# Patient Record
Sex: Female | Born: 1955 | Race: White | Hispanic: No | Marital: Married | Smoking: Never smoker
Health system: Southern US, Community
[De-identification: ages and names within clinical notes are randomized; demographics above are authoritative.]

## PROBLEM LIST (undated history)

## (undated) DIAGNOSIS — E039 Hypothyroidism, unspecified: Secondary | ICD-10-CM

## (undated) DIAGNOSIS — C801 Malignant (primary) neoplasm, unspecified: Secondary | ICD-10-CM

## (undated) DIAGNOSIS — T7840XA Allergy, unspecified, initial encounter: Secondary | ICD-10-CM

## (undated) DIAGNOSIS — F32A Depression, unspecified: Secondary | ICD-10-CM

## (undated) DIAGNOSIS — E119 Type 2 diabetes mellitus without complications: Secondary | ICD-10-CM

## (undated) DIAGNOSIS — Z5189 Encounter for other specified aftercare: Secondary | ICD-10-CM

## (undated) DIAGNOSIS — N189 Chronic kidney disease, unspecified: Secondary | ICD-10-CM

## (undated) DIAGNOSIS — K219 Gastro-esophageal reflux disease without esophagitis: Secondary | ICD-10-CM

## (undated) DIAGNOSIS — I10 Essential (primary) hypertension: Secondary | ICD-10-CM

## (undated) DIAGNOSIS — F419 Anxiety disorder, unspecified: Secondary | ICD-10-CM

## (undated) DIAGNOSIS — Z87442 Personal history of urinary calculi: Secondary | ICD-10-CM

## (undated) DIAGNOSIS — H269 Unspecified cataract: Secondary | ICD-10-CM

## (undated) DIAGNOSIS — Z9889 Other specified postprocedural states: Secondary | ICD-10-CM

## (undated) DIAGNOSIS — M1990 Unspecified osteoarthritis, unspecified site: Secondary | ICD-10-CM

## (undated) DIAGNOSIS — E7800 Pure hypercholesterolemia, unspecified: Secondary | ICD-10-CM

## (undated) HISTORY — DX: Encounter for other specified aftercare: Z51.89

## (undated) HISTORY — DX: Depression, unspecified: F32.A

## (undated) HISTORY — DX: Allergy, unspecified, initial encounter: T78.40XA

## (undated) HISTORY — PX: BREAST SURGERY: SHX581

## (undated) HISTORY — PX: ABDOMINAL HYSTERECTOMY: SHX81

## (undated) HISTORY — DX: Unspecified cataract: H26.9

## (undated) HISTORY — DX: Chronic kidney disease, unspecified: N18.9

## (undated) HISTORY — PX: CHOLECYSTECTOMY: SHX55

## (undated) HISTORY — PX: APPENDECTOMY: SHX54

## (undated) HISTORY — PX: TONSILLECTOMY: SUR1361

---

## 2006-12-06 ENCOUNTER — Emergency Department (HOSPITAL_COMMUNITY): Admission: EM | Admit: 2006-12-06 | Discharge: 2006-12-06 | Payer: Self-pay | Admitting: Emergency Medicine

## 2006-12-08 ENCOUNTER — Ambulatory Visit (HOSPITAL_COMMUNITY): Payer: Self-pay | Admitting: Emergency Medicine

## 2006-12-08 ENCOUNTER — Encounter (HOSPITAL_COMMUNITY): Admission: RE | Admit: 2006-12-08 | Discharge: 2007-01-07 | Payer: Self-pay | Admitting: Emergency Medicine

## 2009-04-04 ENCOUNTER — Emergency Department: Payer: Self-pay | Admitting: Emergency Medicine

## 2009-12-05 ENCOUNTER — Ambulatory Visit (HOSPITAL_COMMUNITY): Admission: RE | Admit: 2009-12-05 | Discharge: 2009-12-05 | Payer: Self-pay | Admitting: Podiatry

## 2010-04-15 LAB — GLUCOSE, CAPILLARY: Glucose-Capillary: 134 mg/dL — ABNORMAL HIGH (ref 70–99)

## 2010-04-16 LAB — BASIC METABOLIC PANEL
BUN: 20 mg/dL (ref 6–23)
Calcium: 9.9 mg/dL (ref 8.4–10.5)
Creatinine, Ser: 0.77 mg/dL (ref 0.4–1.2)
GFR calc non Af Amer: >60 mL/min (ref >60)
Glucose, Bld: 82 mg/dL (ref 70–99)
Potassium: 4.3 mEq/L (ref 3.5–5.1)

## 2010-04-16 LAB — HEMOGLOBIN AND HEMATOCRIT, BLOOD: Hemoglobin: 14.2 g/dL (ref 12.0–15.0)

## 2010-04-16 LAB — SURGICAL PCR SCREEN: MRSA, PCR: NEGATIVE

## 2011-09-17 IMAGING — CR DG FOOT COMPLETE 3+V*R*
3 series · 3 of 3 positions shown · non-contrast
Comparison: 11/28/2009.

CLINICAL DATA: This post surgery.  History of hammertoe of fifth
toe.  History of exostosis of fifth toe.

RIGHT FOOT COMPLETE - 3+ VIEW

[view not recorded (1 of 3)]
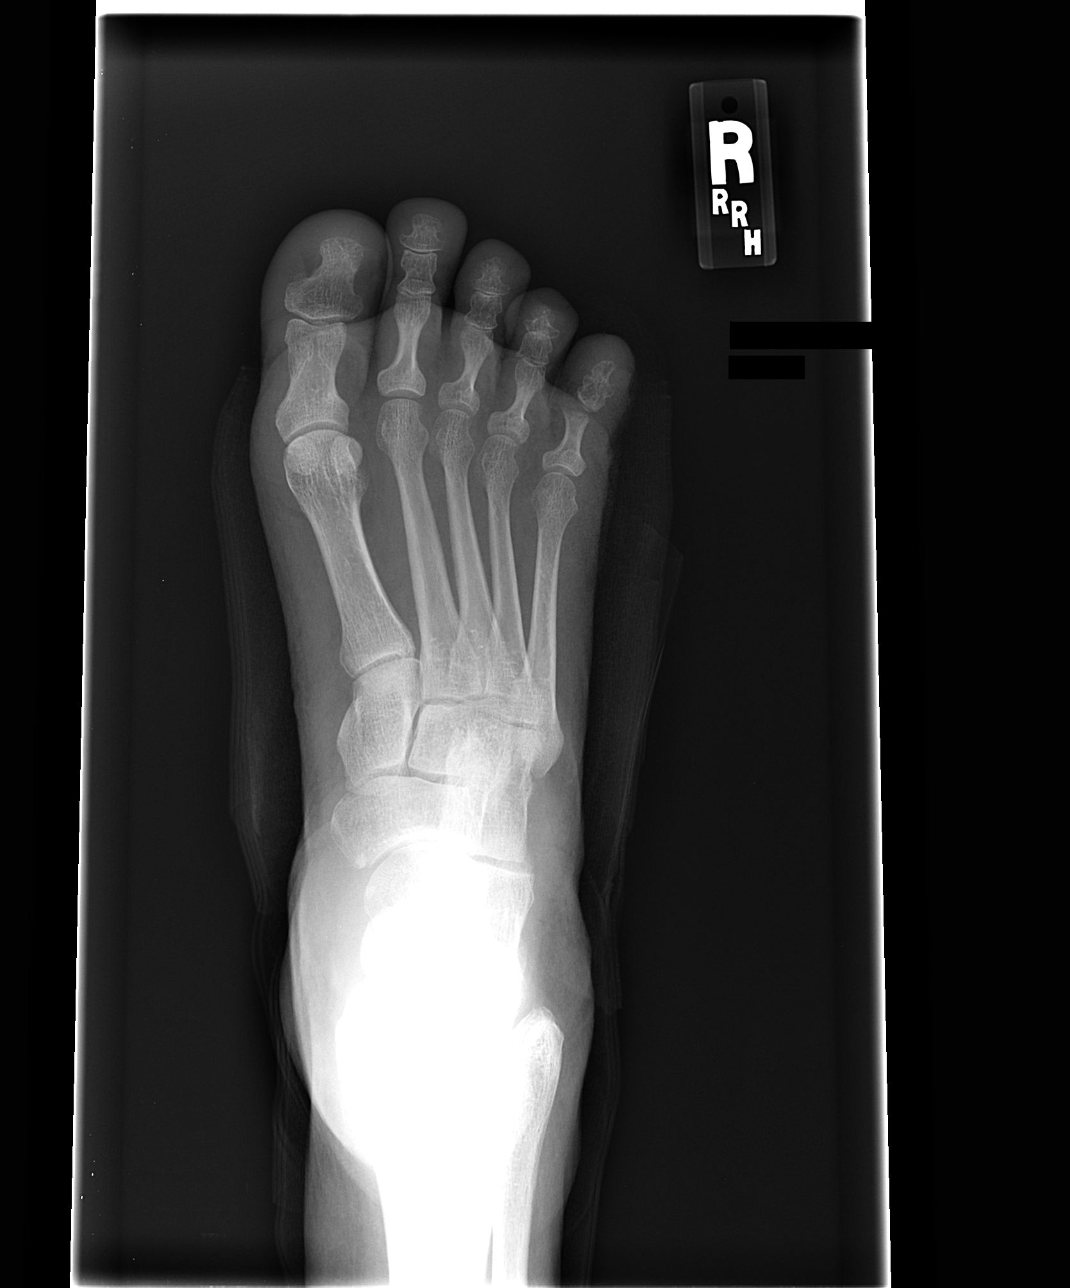

[view not recorded (2 of 3)]
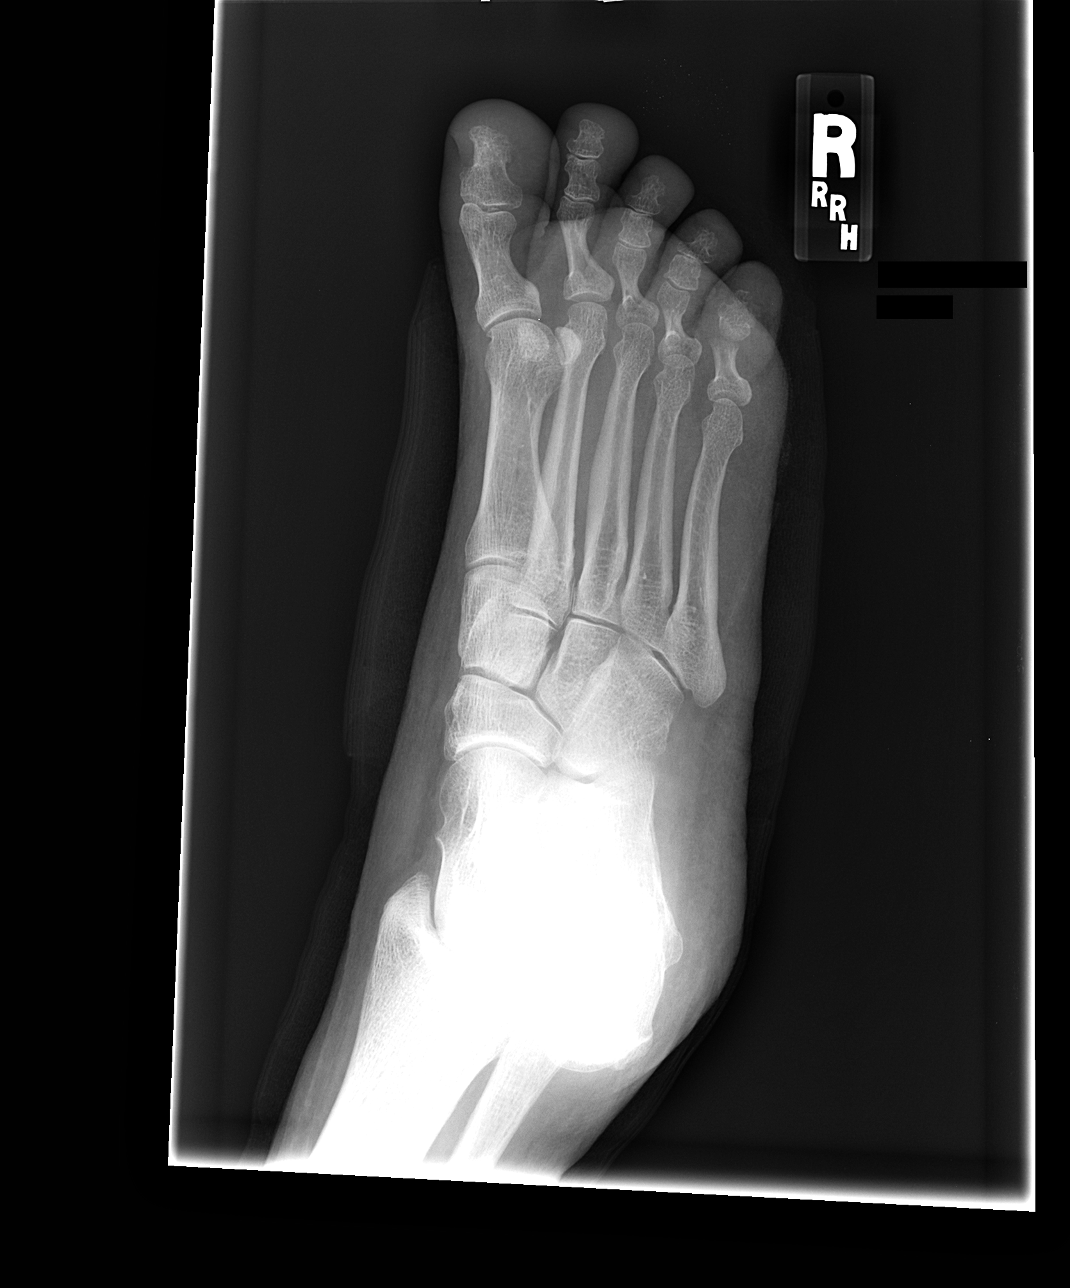

[view not recorded (3 of 3)]
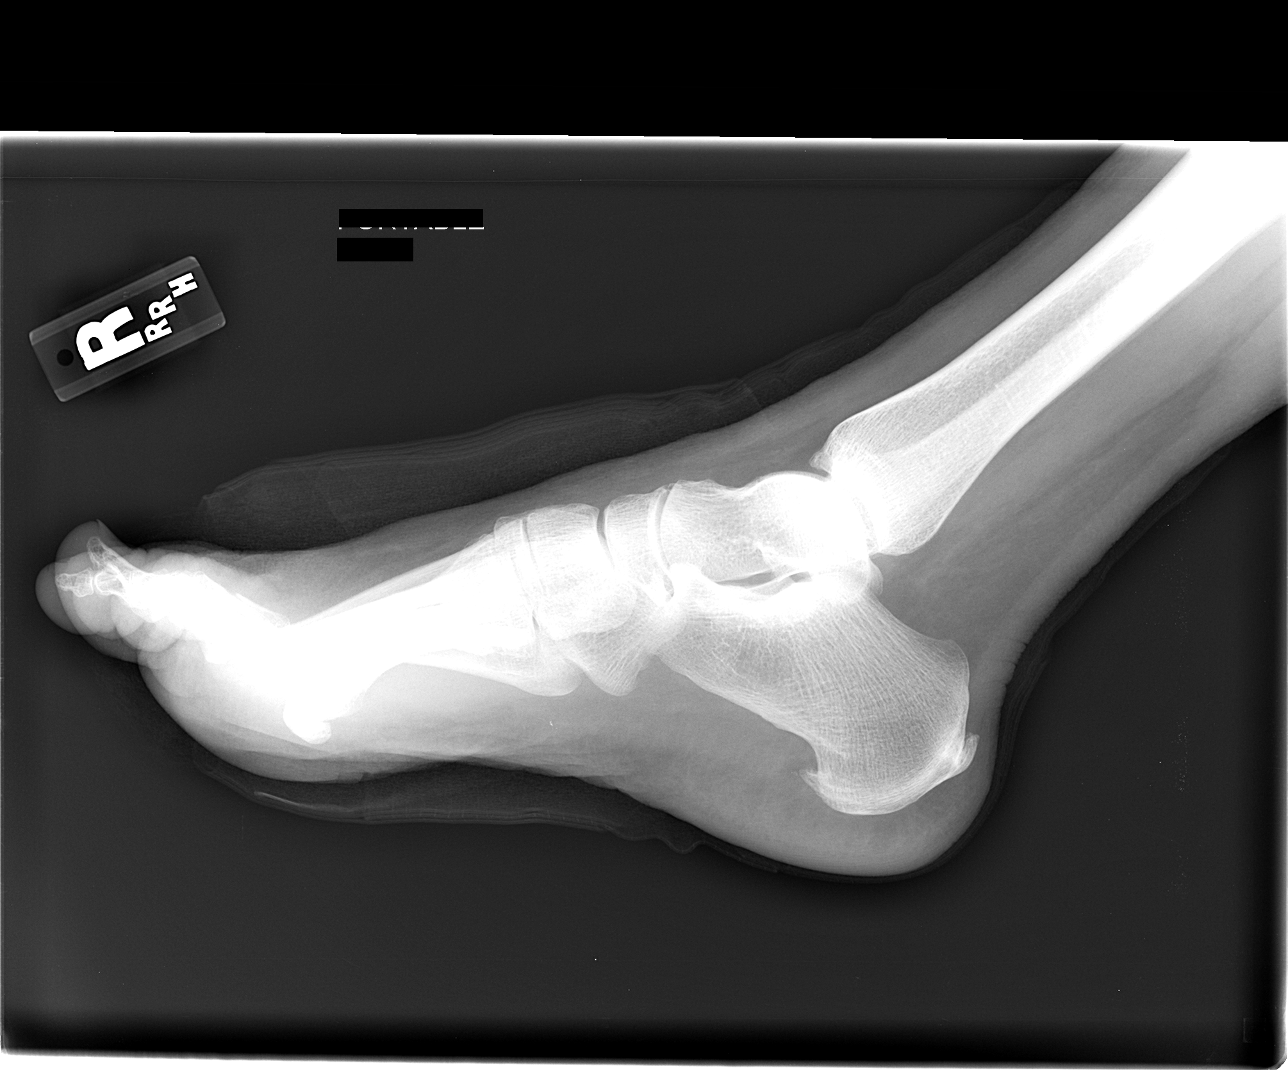

[3 of 3 positions shown; findings below may reference images not displayed]

FINDINGS: There is slightly osteopenic appearance of bones.
Interval osteotomy of the distal aspect of the proximal phalanx of
the fifth toe has been performed at the PIP joint.  There is soft
tissue edema at the operative site.  Posterior and plantar
calcaneal spurring is present.
IMPRESSION: Postoperative appearance.  Osteotomy of the distal aspect of the
proximal phalanx of the fifth toe has been performed at the PIP
joint.

## 2012-11-11 ENCOUNTER — Encounter (HOSPITAL_COMMUNITY): Payer: Self-pay | Admitting: Pharmacy Technician

## 2012-11-18 NOTE — Patient Instructions (Signed)
Your procedure is scheduled on: 11/28/2012  Report to [facility] at  615   AM.  Call this number if you have problems the morning of surgery: [phone]   Do not eat food or drink liquids :After Midnight.      Take these medicines the morning of surgery with A SIP OF WATER: synthroid, cozaar, effexor   Do not wear jewelry, make-up or nail polish.  Do not wear lotions, powders, or perfumes.   Do not shave 48 hours prior to surgery.  Do not bring valuables to the hospital.  Contacts, dentures or bridgework may not be worn into surgery.  Leave suitcase in the car. After surgery it may be brought to your room.  For patients admitted to the hospital, checkout time is 11:00 AM the day of discharge.   Patients discharged the day of surgery will not be allowed to drive home.  :     Please read over the following fact sheets that you were given: Coughing and Deep Breathing, Surgical Site Infection Prevention, Anesthesia Post-op Instructions and Care and Recovery After Surgery    Cataract A cataract is a clouding of the lens of the eye. When a lens becomes cloudy, vision is reduced based on the degree and nature of the clouding. Many cataracts reduce vision to some degree. Some cataracts make people more near-sighted as they develop. Other cataracts increase glare. Cataracts that are ignored and become worse can sometimes look white. The white color can be seen through the pupil. CAUSES   Aging. However, cataracts may occur at any age, even in newborns.   Certain drugs.   Trauma to the eye.   Certain diseases such as diabetes.   Specific eye diseases such as chronic inflammation inside the eye or a sudden attack of a rare form of glaucoma.   Inherited or acquired medical problems.  SYMPTOMS   Gradual, progressive drop in vision in the affected eye.   Severe, rapid visual loss. This most often happens when trauma is the cause.  DIAGNOSIS  To detect a cataract, an eye doctor examines  the lens. Cataracts are best diagnosed with an exam of the eyes with the pupils enlarged (dilated) by drops.  TREATMENT  For an early cataract, vision may improve by using different eyeglasses or stronger lighting. If that does not help your vision, surgery is the only effective treatment. A cataract needs to be surgically removed when vision loss interferes with your everyday activities, such as driving, reading, or watching TV. A cataract may also have to be removed if it prevents examination or treatment of another eye problem. Surgery removes the cloudy lens and usually replaces it with a substitute lens (intraocular lens, IOL).  At a time when both you and your doctor agree, the cataract will be surgically removed. If you have cataracts in both eyes, only one is usually removed at a time. This allows the operated eye to heal and be out of danger from any possible problems after surgery (such as infection or poor wound healing). In rare cases, a cataract may be doing damage to your eye. In these cases, your caregiver may advise surgical removal right away. The vast majority of people who have cataract surgery have better vision afterward. HOME CARE INSTRUCTIONS  If you are not planning surgery, you may be asked to do the following:  Use different eyeglasses.   Use stronger or brighter lighting.   Ask your eye doctor about reducing your medicine dose or  changing medicines if it is thought that a medicine caused your cataract. Changing medicines does not make the cataract go away on its own.   Become familiar with your surroundings. Poor vision can lead to injury. Avoid bumping into things on the affected side. You are at a higher risk for tripping or falling.   Exercise extreme care when driving or operating machinery.   Wear sunglasses if you are sensitive to bright light or experiencing problems with glare.  SEEK IMMEDIATE MEDICAL CARE IF:   You have a worsening or sudden vision loss.    You notice redness, swelling, or increasing pain in the eye.   You have a fever.  Document Released: 01/19/2005 Document Revised: 01/08/2011 Document Reviewed: 09/12/2010 [facility] Patient Information 2012 [place].PATIENT INSTRUCTIONS POST-ANESTHESIA  IMMEDIATELY FOLLOWING SURGERY:  Do not drive or operate machinery for the first twenty four hours after surgery.  Do not make any important decisions for twenty four hours after surgery or while taking narcotic pain medications or sedatives.  If you develop intractable nausea and vomiting or a severe headache please notify your doctor immediately.  FOLLOW-UP:  Please make an appointment with your surgeon as instructed. You do not need to follow up with anesthesia unless specifically instructed to do so.  WOUND CARE INSTRUCTIONS (if applicable):  Keep a dry clean dressing on the anesthesia/puncture wound site if there is drainage.  Once the wound has quit draining you may leave it open to air.  Generally you should leave the bandage intact for twenty four hours unless there is drainage.  If the epidural site drains for more than 36-48 hours please call the anesthesia department.  QUESTIONS?:  Please feel free to call your physician or the hospital operator if you have any questions, and they will be happy to assist you.

## 2012-11-24 ENCOUNTER — Encounter (HOSPITAL_COMMUNITY): Payer: Self-pay

## 2012-11-24 ENCOUNTER — Other Ambulatory Visit: Payer: Self-pay

## 2012-11-24 ENCOUNTER — Encounter (HOSPITAL_COMMUNITY)
Admission: RE | Admit: 2012-11-24 | Discharge: 2012-11-24 | Disposition: A | Discharge status: Home / Self Care | Payer: PRIVATE HEALTH INSURANCE | Source: Ambulatory Visit | Attending: Ophthalmology | Admitting: Ophthalmology

## 2012-11-24 DIAGNOSIS — Z01812 Encounter for preprocedural laboratory examination: Secondary | ICD-10-CM | POA: Insufficient documentation

## 2012-11-24 HISTORY — DX: Unspecified osteoarthritis, unspecified site: M19.90

## 2012-11-24 HISTORY — DX: Anxiety disorder, unspecified: F41.9

## 2012-11-24 HISTORY — DX: Hypothyroidism, unspecified: E03.9

## 2012-11-24 HISTORY — DX: Pure hypercholesterolemia, unspecified: E78.00

## 2012-11-24 HISTORY — DX: Type 2 diabetes mellitus without complications: E11.9

## 2012-11-24 HISTORY — DX: Essential (primary) hypertension: I10

## 2012-11-24 LAB — HEMOGLOBIN AND HEMATOCRIT, BLOOD
HCT: 47.4 % — ABNORMAL HIGH (ref 36.0–46.0)
Hemoglobin: 16 g/dL — ABNORMAL HIGH (ref 12.0–15.0)

## 2012-11-24 LAB — BASIC METABOLIC PANEL WITH GFR
BUN: 20 mg/dL (ref 6–23)
CO2: 30 meq/L (ref 19–32)
Calcium: 10.1 mg/dL (ref 8.4–10.5)
Chloride: 101 meq/L (ref 96–112)
Creatinine, Ser: 0.65 mg/dL (ref 0.50–1.10)
GFR calc Af Amer: >90 mL/min
GFR calc non Af Amer: >90 mL/min
Glucose, Bld: 110 mg/dL — ABNORMAL HIGH (ref 70–99)
Potassium: 4.1 meq/L (ref 3.5–5.1)
Sodium: 141 meq/L (ref 135–145)

## 2012-11-25 MED ORDER — TETRACAINE HCL 0.5 % OP SOLN
OPHTHALMIC | Status: AC
  Filled 2012-11-25: qty 2

## 2012-11-25 MED ORDER — LIDOCAINE HCL (PF) 1 % IJ SOLN
INTRAMUSCULAR | Status: AC
  Filled 2012-11-25: qty 2

## 2012-11-25 MED ORDER — NEOMYCIN-POLYMYXIN-DEXAMETH 3.5-10000-0.1 OP SUSP
OPHTHALMIC | Status: AC
  Filled 2012-11-25: qty 5

## 2012-11-25 MED ORDER — CYCLOPENTOLATE-PHENYLEPHRINE OP SOLN OPTIME - NO CHARGE
OPHTHALMIC | Status: AC
  Filled 2012-11-25: qty 2

## 2012-11-25 MED ORDER — PHENYLEPHRINE HCL 2.5 % OP SOLN
OPHTHALMIC | Status: AC
  Filled 2012-11-25: qty 15

## 2012-11-25 MED ORDER — LIDOCAINE HCL 3.5 % OP GEL
OPHTHALMIC | Status: AC
  Filled 2012-11-25: qty 1

## 2012-11-28 ENCOUNTER — Encounter (HOSPITAL_COMMUNITY)
Admission: RE | Disposition: A | Discharge status: Home / Self Care | Payer: Self-pay | Source: Ambulatory Visit | Attending: Ophthalmology

## 2012-11-28 ENCOUNTER — Encounter (HOSPITAL_COMMUNITY): Payer: PRIVATE HEALTH INSURANCE | Admitting: Anesthesiology

## 2012-11-28 ENCOUNTER — Ambulatory Visit (HOSPITAL_COMMUNITY)
Admission: RE | Admit: 2012-11-28 | Discharge: 2012-11-28 | Disposition: A | Discharge status: Home / Self Care | Payer: PRIVATE HEALTH INSURANCE | Source: Ambulatory Visit | Attending: Ophthalmology | Admitting: Ophthalmology

## 2012-11-28 ENCOUNTER — Encounter (HOSPITAL_COMMUNITY): Payer: Self-pay | Admitting: *Deleted

## 2012-11-28 ENCOUNTER — Ambulatory Visit (HOSPITAL_COMMUNITY): Payer: PRIVATE HEALTH INSURANCE | Admitting: Anesthesiology

## 2012-11-28 DIAGNOSIS — E119 Type 2 diabetes mellitus without complications: Secondary | ICD-10-CM | POA: Insufficient documentation

## 2012-11-28 DIAGNOSIS — I10 Essential (primary) hypertension: Secondary | ICD-10-CM | POA: Insufficient documentation

## 2012-11-28 DIAGNOSIS — H2589 Other age-related cataract: Secondary | ICD-10-CM | POA: Insufficient documentation

## 2012-11-28 DIAGNOSIS — Z01812 Encounter for preprocedural laboratory examination: Secondary | ICD-10-CM | POA: Insufficient documentation

## 2012-11-28 HISTORY — PX: CATARACT EXTRACTION W/PHACO: SHX586

## 2012-11-28 SURGERY — PHACOEMULSIFICATION, CATARACT, WITH IOL INSERTION
Anesthesia: Monitor Anesthesia Care | Site: Eye | Laterality: Left | Wound class: Clean

## 2012-11-28 MED ORDER — CYCLOPENTOLATE-PHENYLEPHRINE 0.2-1 % OP SOLN
1.0000 [drp] | OPHTHALMIC | Status: AC
  Administered 2012-11-28 (×3): 1 [drp] via OPHTHALMIC

## 2012-11-28 MED ORDER — MIDAZOLAM HCL 2 MG/2ML IJ SOLN
INTRAMUSCULAR | Status: AC
  Filled 2012-11-28: qty 2

## 2012-11-28 MED ORDER — TETRACAINE HCL 0.5 % OP SOLN
1.0000 [drp] | OPHTHALMIC | Status: AC
Start: 2012-11-28 — End: 2012-11-28
  Administered 2012-11-28 (×3): 1 [drp] via OPHTHALMIC

## 2012-11-28 MED ORDER — ONDANSETRON HCL 4 MG/2ML IJ SOLN
4.0000 mg | Freq: Once | INTRAMUSCULAR | Status: AC
  Administered 2012-11-28: 4 mg via INTRAVENOUS

## 2012-11-28 MED ORDER — LIDOCAINE HCL 3.5 % OP GEL
1.0000 "application " | Freq: Once | OPHTHALMIC | Status: AC
  Administered 2012-11-28: 1 via OPHTHALMIC

## 2012-11-28 MED ORDER — EPINEPHRINE HCL 1 MG/ML IJ SOLN
INTRAOCULAR | Status: DC | PRN
  Administered 2012-11-28: 07:00:00

## 2012-11-28 MED ORDER — PROVISC 10 MG/ML IO SOLN
INTRAOCULAR | Status: DC | PRN
  Administered 2012-11-28: 8.5 mg via INTRAOCULAR

## 2012-11-28 MED ORDER — POVIDONE-IODINE 5 % OP SOLN
OPHTHALMIC | Status: DC | PRN
  Administered 2012-11-28: 1 via OPHTHALMIC

## 2012-11-28 MED ORDER — FENTANYL CITRATE 0.05 MG/ML IJ SOLN
25.0000 ug | INTRAMUSCULAR | Status: AC
  Administered 2012-11-28 (×2): 25 ug via INTRAVENOUS

## 2012-11-28 MED ORDER — MIDAZOLAM HCL 2 MG/2ML IJ SOLN
1.0000 mg | INTRAMUSCULAR | Status: DC | PRN
  Administered 2012-11-28: 2 mg via INTRAVENOUS

## 2012-11-28 MED ORDER — ONDANSETRON HCL 4 MG/2ML IJ SOLN
INTRAMUSCULAR | Status: AC
  Filled 2012-11-28: qty 2

## 2012-11-28 MED ORDER — LACTATED RINGERS IV SOLN
INTRAVENOUS | Status: DC
  Administered 2012-11-28: 07:00:00 via INTRAVENOUS

## 2012-11-28 MED ORDER — EPINEPHRINE HCL 1 MG/ML IJ SOLN
INTRAMUSCULAR | Status: AC
  Filled 2012-11-28: qty 1

## 2012-11-28 MED ORDER — LIDOCAINE HCL (PF) 1 % IJ SOLN
INTRAMUSCULAR | Status: DC | PRN
  Administered 2012-11-28: .6 mL

## 2012-11-28 MED ORDER — PHENYLEPHRINE HCL 2.5 % OP SOLN
1.0000 [drp] | OPHTHALMIC | Status: AC
  Administered 2012-11-28 (×3): 1 [drp] via OPHTHALMIC

## 2012-11-28 MED ORDER — BSS IO SOLN
INTRAOCULAR | Status: DC | PRN
  Administered 2012-11-28: 15 mL via INTRAOCULAR

## 2012-11-28 MED ORDER — FENTANYL CITRATE 0.05 MG/ML IJ SOLN
INTRAMUSCULAR | Status: AC
  Filled 2012-11-28: qty 2

## 2012-11-28 MED ORDER — NEOMYCIN-POLYMYXIN-DEXAMETH 3.5-10000-0.1 OP SUSP
OPHTHALMIC | Status: DC | PRN
  Administered 2012-11-28: 2 [drp] via OPHTHALMIC

## 2012-11-28 MED ORDER — LIDOCAINE 3.5 % OP GEL OPTIME - NO CHARGE
OPHTHALMIC | Status: DC | PRN
  Administered 2012-11-28: 2 [drp] via OPHTHALMIC

## 2012-11-28 SURGICAL SUPPLY — 32 items
CAPSULAR TENSION RING-AMO (OPHTHALMIC RELATED) IMPLANT
CLOTH BEACON ORANGE TIMEOUT ST (SAFETY) ×2 IMPLANT
EYE SHIELD UNIVERSAL CLEAR (GAUZE/BANDAGES/DRESSINGS) ×2 IMPLANT
GLOVE BIO SURGEON STRL SZ 6.5 (GLOVE) IMPLANT
GLOVE BIOGEL PI IND STRL 6.5 (GLOVE) IMPLANT
GLOVE BIOGEL PI IND STRL 7.0 (GLOVE) ×1 IMPLANT
GLOVE BIOGEL PI IND STRL 7.5 (GLOVE) IMPLANT
GLOVE BIOGEL PI INDICATOR 6.5 (GLOVE)
GLOVE BIOGEL PI INDICATOR 7.0 (GLOVE) ×1
GLOVE BIOGEL PI INDICATOR 7.5 (GLOVE)
GLOVE ECLIPSE 6.5 STRL STRAW (GLOVE) IMPLANT
GLOVE ECLIPSE 7.0 STRL STRAW (GLOVE) IMPLANT
GLOVE ECLIPSE 7.5 STRL STRAW (GLOVE) IMPLANT
GLOVE EXAM NITRILE LRG STRL (GLOVE) IMPLANT
GLOVE EXAM NITRILE MD LF STRL (GLOVE) ×2 IMPLANT
GLOVE SKINSENSE NS SZ6.5 (GLOVE)
GLOVE SKINSENSE NS SZ7.0 (GLOVE)
GLOVE SKINSENSE STRL SZ6.5 (GLOVE) IMPLANT
GLOVE SKINSENSE STRL SZ7.0 (GLOVE) IMPLANT
KIT VITRECTOMY (OPHTHALMIC RELATED) IMPLANT
PAD ARMBOARD 7.5X6 YLW CONV (MISCELLANEOUS) ×2 IMPLANT
PROC W NO LENS (INTRAOCULAR LENS)
PROC W SPEC LENS (INTRAOCULAR LENS)
PROCESS W NO LENS (INTRAOCULAR LENS) IMPLANT
PROCESS W SPEC LENS (INTRAOCULAR LENS) IMPLANT
RING MALYGIN (MISCELLANEOUS) IMPLANT
SIGHTPATH CAT PROC W REG LENS (Ophthalmic Related) ×2 IMPLANT
SYR TB 1ML LL NO SAFETY (SYRINGE) ×2 IMPLANT
TAPE SURG TRANSPORE 1 IN (GAUZE/BANDAGES/DRESSINGS) ×1 IMPLANT
TAPE SURGICAL TRANSPORE 1 IN (GAUZE/BANDAGES/DRESSINGS) ×1
VISCOELASTIC ADDITIONAL (OPHTHALMIC RELATED) IMPLANT
WATER STERILE IRR 250ML POUR (IV SOLUTION) ×2 IMPLANT

## 2012-11-28 NOTE — Transfer of Care (Signed)
Immediate Anesthesia Transfer of Care Note  Patient: Shelley Jenkins  Procedure(s) Performed: Procedure(s) with comments: CATARACT EXTRACTION PHACO AND INTRAOCULAR LENS PLACEMENT (IOC) (Left) - CDE:  11.60  Patient Location: Short Stay  Anesthesia Type:MAC  Level of Consciousness: awake, alert , oriented and patient cooperative  Airway & Oxygen Therapy: Patient Spontanous Breathing  Post-op Assessment: Report given to PACU RN and Post -op Vital signs reviewed and stable  Post vital signs: Reviewed and stable  Complications: No apparent anesthesia complications

## 2012-11-28 NOTE — H&P (Signed)
I have reviewed the H&P, the patient was re-examined, and I have identified no interval changes in medical condition and plan of care since the history and physical of record  

## 2012-11-28 NOTE — Anesthesia Postprocedure Evaluation (Signed)
  Anesthesia Post-op Note  Patient: Shelley Jenkins  Procedure(s) Performed: Procedure(s) with comments: CATARACT EXTRACTION PHACO AND INTRAOCULAR LENS PLACEMENT (IOC) (Left) - CDE:  11.60  Patient Location: Short Stay  Anesthesia Type:MAC  Level of Consciousness: awake, alert , oriented and patient cooperative  Airway and Oxygen Therapy: Patient Spontanous Breathing  Post-op Pain: none  Post-op Assessment: Post-op Vital signs reviewed, Patient's Cardiovascular Status Stable, Respiratory Function Stable, Patent Airway and Pain level controlled  Post-op Vital Signs: Reviewed and stable  Complications: No apparent anesthesia complications

## 2012-11-28 NOTE — Op Note (Signed)
Date of Admission: 11/28/2012  Date of Surgery: 11/28/2012  Pre-Op Dx: Cataract  Left  Eye  Post-Op Dx: Combined Cataract  Left  Eye,  Dx Code 366.19  Surgeon: Gemma Payor, M.D.  Assistants: None  Anesthesia: Topical with MAC  Indications: Painless, progressive loss of vision with compromise of daily activities.  Surgery: Cataract Extraction with Intraocular lens Implant Left Eye  Discription: The patient had dilating drops and viscous lidocaine placed into the left eye in the pre-op holding area. After transfer to the operating room, a time out was performed. The patient was then prepped and draped. Beginning with a 75 degree blade a paracentesis port was made at the surgeon's 2 o'clock position. The anterior chamber was then filled with 1% non-preserved lidocaine. This was followed by filling the anterior chamber with Provisc. A 2.15mm keratome blade was used to make a clear corneal incision at the temporal limbus. A bent cystatome needle was used to create a continuous tear capsulotomy. Hydrodissection was performed with balanced salt solution on a Fine canula. The lens nucleus was then removed using the phacoemulsification handpiece. Residual cortex was removed with the I&A handpiece. The anterior chamber and capsular bag were refilled with Provisc. A posterior chamber intraocular lens was placed into the capsular bag with it's injector. The implant was positioned with the Kuglan hook. The Provisc was then removed from the anterior chamber and capsular bag with the I&A handpiece. Stromal hydration of the main incision and paracentesis port was performed with BSS on a Fine canula. The wounds were tested for leak which was negative. The patient tolerated the procedure well. There were no operative complications. The patient was then transferred to the recovery room in stable condition.  Complications: None  Specimen: None  EBL: None  Prosthetic device: B&L enVista, MX60, power 17.5D, SN  [id number].

## 2012-11-28 NOTE — Anesthesia Preprocedure Evaluation (Signed)
Anesthesia Evaluation  Patient identified by MRN, date of birth, ID band Patient awake    Reviewed: Allergy & Precautions, H&P , NPO status , Patient's Chart, lab work & pertinent test results  Airway Mallampati: III    Mouth opening: Limited Mouth Opening  Dental  (+) Teeth Intact   Pulmonary neg pulmonary ROS,  breath sounds clear to auscultation        Cardiovascular hypertension, Pt. on medications Rhythm:Regular Rate:Normal     Neuro/Psych PSYCHIATRIC DISORDERS Anxiety    GI/Hepatic negative GI ROS,   Endo/Other  diabetes, Well Controlled, Type 2, Oral Hypoglycemic AgentsHypothyroidism   Renal/GU      Musculoskeletal   Abdominal   Peds  Hematology   Anesthesia Other Findings   Reproductive/Obstetrics                           Anesthesia Physical Anesthesia Plan  ASA: III  Anesthesia Plan: MAC   Post-op Pain Management:    Induction: Intravenous  Airway Management Planned: Nasal Cannula  Additional Equipment:   Intra-op Plan:   Post-operative Plan:   Informed Consent: I have reviewed the patients History and Physical, chart, labs and discussed the procedure including the risks, benefits and alternatives for the proposed anesthesia with the patient or authorized representative who has indicated his/her understanding and acceptance.     Plan Discussed with:   Anesthesia Plan Comments:         Anesthesia Quick Evaluation

## 2012-11-30 ENCOUNTER — Encounter (HOSPITAL_COMMUNITY): Payer: Self-pay | Admitting: Ophthalmology

## 2012-12-01 ENCOUNTER — Encounter (HOSPITAL_COMMUNITY): Payer: Self-pay | Admitting: Pharmacy Technician

## 2012-12-05 ENCOUNTER — Encounter (HOSPITAL_COMMUNITY)
Admission: RE | Admit: 2012-12-05 | Discharge: 2012-12-05 | Disposition: A | Discharge status: Home / Self Care | Payer: PRIVATE HEALTH INSURANCE | Source: Ambulatory Visit

## 2012-12-07 MED ORDER — CYCLOPENTOLATE-PHENYLEPHRINE OP SOLN OPTIME - NO CHARGE
OPHTHALMIC | Status: AC
  Filled 2012-12-07: qty 2

## 2012-12-07 MED ORDER — NEOMYCIN-POLYMYXIN-DEXAMETH 3.5-10000-0.1 OP SUSP
OPHTHALMIC | Status: AC
  Filled 2012-12-07: qty 5

## 2012-12-07 MED ORDER — LIDOCAINE HCL (PF) 1 % IJ SOLN
INTRAMUSCULAR | Status: AC
  Filled 2012-12-07: qty 2

## 2012-12-07 MED ORDER — PHENYLEPHRINE HCL 2.5 % OP SOLN
OPHTHALMIC | Status: AC
  Filled 2012-12-07: qty 15

## 2012-12-07 MED ORDER — TETRACAINE HCL 0.5 % OP SOLN
OPHTHALMIC | Status: AC
  Filled 2012-12-07: qty 2

## 2012-12-07 MED ORDER — LIDOCAINE HCL 3.5 % OP GEL
OPHTHALMIC | Status: AC
  Filled 2012-12-07: qty 1

## 2012-12-08 ENCOUNTER — Encounter (HOSPITAL_COMMUNITY)
Admission: RE | Disposition: A | Discharge status: Home / Self Care | Payer: Self-pay | Source: Ambulatory Visit | Attending: Ophthalmology

## 2012-12-08 ENCOUNTER — Ambulatory Visit (HOSPITAL_COMMUNITY)
Admission: RE | Admit: 2012-12-08 | Discharge: 2012-12-08 | Disposition: A | Discharge status: Home / Self Care | Payer: PRIVATE HEALTH INSURANCE | Source: Ambulatory Visit | Attending: Ophthalmology | Admitting: Ophthalmology

## 2012-12-08 ENCOUNTER — Encounter (HOSPITAL_COMMUNITY): Payer: PRIVATE HEALTH INSURANCE | Admitting: Anesthesiology

## 2012-12-08 ENCOUNTER — Encounter (HOSPITAL_COMMUNITY): Payer: Self-pay | Admitting: *Deleted

## 2012-12-08 ENCOUNTER — Ambulatory Visit (HOSPITAL_COMMUNITY): Payer: PRIVATE HEALTH INSURANCE | Admitting: Anesthesiology

## 2012-12-08 DIAGNOSIS — E119 Type 2 diabetes mellitus without complications: Secondary | ICD-10-CM | POA: Insufficient documentation

## 2012-12-08 DIAGNOSIS — Z01812 Encounter for preprocedural laboratory examination: Secondary | ICD-10-CM | POA: Insufficient documentation

## 2012-12-08 DIAGNOSIS — H2510 Age-related nuclear cataract, unspecified eye: Secondary | ICD-10-CM | POA: Insufficient documentation

## 2012-12-08 DIAGNOSIS — I10 Essential (primary) hypertension: Secondary | ICD-10-CM | POA: Insufficient documentation

## 2012-12-08 HISTORY — PX: CATARACT EXTRACTION W/PHACO: SHX586

## 2012-12-08 LAB — GLUCOSE, CAPILLARY: Glucose-Capillary: 166 mg/dL — ABNORMAL HIGH (ref 70–99)

## 2012-12-08 SURGERY — PHACOEMULSIFICATION, CATARACT, WITH IOL INSERTION
Anesthesia: Monitor Anesthesia Care | Site: Eye | Laterality: Right | Wound class: Clean

## 2012-12-08 MED ORDER — EPINEPHRINE HCL 1 MG/ML IJ SOLN
INTRAMUSCULAR | Status: AC
  Filled 2012-12-08: qty 1

## 2012-12-08 MED ORDER — MIDAZOLAM HCL 2 MG/2ML IJ SOLN
1.0000 mg | INTRAMUSCULAR | Status: DC | PRN
  Administered 2012-12-08: 2 mg via INTRAVENOUS

## 2012-12-08 MED ORDER — EPINEPHRINE HCL 1 MG/ML IJ SOLN
INTRAMUSCULAR | Status: DC | PRN
  Administered 2012-12-08: 08:00:00

## 2012-12-08 MED ORDER — TETRACAINE HCL 0.5 % OP SOLN
1.0000 [drp] | OPHTHALMIC | Status: AC
  Administered 2012-12-08 (×3): 1 [drp] via OPHTHALMIC

## 2012-12-08 MED ORDER — MIDAZOLAM HCL 2 MG/2ML IJ SOLN
INTRAMUSCULAR | Status: AC
  Filled 2012-12-08: qty 2

## 2012-12-08 MED ORDER — CYCLOPENTOLATE-PHENYLEPHRINE 0.2-1 % OP SOLN
1.0000 [drp] | OPHTHALMIC | Status: AC
  Administered 2012-12-08 (×3): 1 [drp] via OPHTHALMIC

## 2012-12-08 MED ORDER — LACTATED RINGERS IV SOLN
INTRAVENOUS | Status: DC
  Administered 2012-12-08: 1000 mL via INTRAVENOUS

## 2012-12-08 MED ORDER — NEOMYCIN-POLYMYXIN-DEXAMETH 3.5-10000-0.1 OP SUSP
OPHTHALMIC | Status: DC | PRN
  Administered 2012-12-08: 2 [drp] via OPHTHALMIC

## 2012-12-08 MED ORDER — POVIDONE-IODINE 5 % OP SOLN
OPHTHALMIC | Status: DC | PRN
  Administered 2012-12-08: 1 via OPHTHALMIC

## 2012-12-08 MED ORDER — PROVISC 10 MG/ML IO SOLN
INTRAOCULAR | Status: DC | PRN
  Administered 2012-12-08: 0.85 mL via INTRAOCULAR

## 2012-12-08 MED ORDER — CARBACHOL 0.01 % IO SOLN
INTRAOCULAR | Status: AC
  Filled 2012-12-08: qty 1.5

## 2012-12-08 MED ORDER — BSS IO SOLN
INTRAOCULAR | Status: DC | PRN
  Administered 2012-12-08: 15 mL via INTRAOCULAR

## 2012-12-08 MED ORDER — MIDAZOLAM HCL 5 MG/5ML IJ SOLN
INTRAMUSCULAR | Status: DC | PRN
  Administered 2012-12-08: 2 mg via INTRAVENOUS

## 2012-12-08 MED ORDER — FENTANYL CITRATE 0.05 MG/ML IJ SOLN
INTRAMUSCULAR | Status: AC
  Filled 2012-12-08: qty 2

## 2012-12-08 MED ORDER — ONDANSETRON HCL 4 MG/2ML IJ SOLN
INTRAMUSCULAR | Status: AC
  Filled 2012-12-08: qty 2

## 2012-12-08 MED ORDER — ONDANSETRON HCL 4 MG/2ML IJ SOLN
4.0000 mg | Freq: Once | INTRAMUSCULAR | Status: AC
  Administered 2012-12-08: 4 mg via INTRAVENOUS

## 2012-12-08 MED ORDER — PHENYLEPHRINE HCL 2.5 % OP SOLN
1.0000 [drp] | OPHTHALMIC | Status: AC
  Administered 2012-12-08 (×3): 1 [drp] via OPHTHALMIC

## 2012-12-08 MED ORDER — LIDOCAINE HCL (PF) 1 % IJ SOLN
INTRAMUSCULAR | Status: DC | PRN
  Administered 2012-12-08: .4 mL

## 2012-12-08 MED ORDER — LIDOCAINE HCL 3.5 % OP GEL
1.0000 "application " | Freq: Once | OPHTHALMIC | Status: AC
  Administered 2012-12-08: 1 via OPHTHALMIC

## 2012-12-08 MED ORDER — FENTANYL CITRATE 0.05 MG/ML IJ SOLN
25.0000 ug | INTRAMUSCULAR | Status: AC
  Administered 2012-12-08 (×2): 25 ug via INTRAVENOUS

## 2012-12-08 SURGICAL SUPPLY — 32 items

## 2012-12-08 NOTE — Anesthesia Postprocedure Evaluation (Signed)
  Anesthesia Post-op Note  Patient: Shelley Jenkins  Procedure(s) Performed: Procedure(s) with comments: CATARACT EXTRACTION PHACO AND INTRAOCULAR LENS PLACEMENT (IOC) (Right) - CDE:17.33  Patient Location: Short Stay  Anesthesia Type:MAC  Level of Consciousness: awake, alert  and oriented  Airway and Oxygen Therapy: Patient Spontanous Breathing  Post-op Pain: none  Post-op Assessment: Post-op Vital signs reviewed, Patient's Cardiovascular Status Stable, Respiratory Function Stable, Patent Airway and No signs of Nausea or vomiting  Post-op Vital Signs: Reviewed and stable  Complications: No apparent anesthesia complications

## 2012-12-08 NOTE — Transfer of Care (Signed)
Immediate Anesthesia Transfer of Care Note  Patient: Shelley Jenkins  Procedure(s) Performed: Procedure(s) with comments: CATARACT EXTRACTION PHACO AND INTRAOCULAR LENS PLACEMENT (IOC) (Right) - CDE:17.33  Patient Location: Short Stay  Anesthesia Type:MAC  Level of Consciousness: awake, alert  and oriented  Airway & Oxygen Therapy: Patient Spontanous Breathing  Post-op Assessment: Report given to PACU RN  Post vital signs: Reviewed  Complications: No apparent anesthesia complications

## 2012-12-08 NOTE — Anesthesia Preprocedure Evaluation (Signed)
Anesthesia Evaluation  Patient identified by MRN, date of birth, ID band Patient awake    Reviewed: Allergy & Precautions, H&P , NPO status , Patient's Chart, lab work & pertinent test results  Airway Mallampati: III    Mouth opening: Limited Mouth Opening  Dental  (+) Teeth Intact   Pulmonary neg pulmonary ROS,  breath sounds clear to auscultation        Cardiovascular hypertension, Pt. on medications Rhythm:Regular Rate:Normal     Neuro/Psych PSYCHIATRIC DISORDERS Anxiety    GI/Hepatic negative GI ROS,   Endo/Other  diabetes, Well Controlled, Type 2, Oral Hypoglycemic AgentsHypothyroidism   Renal/GU      Musculoskeletal   Abdominal   Peds  Hematology   Anesthesia Other Findings   Reproductive/Obstetrics                           Anesthesia Physical Anesthesia Plan  ASA: III  Anesthesia Plan: MAC   Post-op Pain Management:    Induction: Intravenous  Airway Management Planned: Nasal Cannula  Additional Equipment:   Intra-op Plan:   Post-operative Plan:   Informed Consent: I have reviewed the patients History and Physical, chart, labs and discussed the procedure including the risks, benefits and alternatives for the proposed anesthesia with the patient or authorized representative who has indicated his/her understanding and acceptance.     Plan Discussed with:   Anesthesia Plan Comments:         Anesthesia Quick Evaluation  

## 2012-12-08 NOTE — Op Note (Signed)
Date of Admission: 12/08/2012  Date of Surgery: 12/08/2012  Pre-Op Dx: Cataract  Right  Eye  Post-Op Dx: Cataract  Right  Eye,  Dx Code 366.16  Surgeon: Gemma Payor, M.D.  Assistants: None  Anesthesia: Topical with MAC  Indications: Painless, progressive loss of vision with compromise of daily activities.  Surgery: Cataract Extraction with Intraocular lens Implant Right Eye  Discription: The patient had dilating drops and viscous lidocaine placed into the right eye in the pre-op holding area. After transfer to the operating room, a time out was performed. The patient was then prepped and draped. Beginning with a 75 degree blade a paracentesis port was made at the surgeon's 2 o'clock position. The anterior chamber was then filled with 1% non-preserved lidocaine. This was followed by filling the anterior chamber with Provisc.  A 2.42mm keratome blade was used to make a clear corneal incision at the temporal limbus.  A bent cystatome needle was used to create a continuous tear capsulotomy. Hydrodissection was performed with balanced salt solution on a Fine canula. The lens nucleus was then removed using the phacoemulsification handpiece. Residual cortex was removed with the I&A handpiece. The anterior chamber and capsular bag were refilled with Provisc. A posterior chamber intraocular lens was placed into the capsular bag with it's injector. The implant was positioned with the Kuglan hook. The Provisc was then removed from the anterior chamber and capsular bag with the I&A handpiece. Stromal hydration of the main incision and paracentesis port was performed with BSS on a Fine canula. The wounds were tested for leak which was negative. The patient tolerated the procedure well. There were no operative complications. The patient was then transferred to the recovery room in stable condition.  Complications: None  Specimen: None  EBL: None  Prosthetic device: B&L enVista, MX60, power 17.0D, SN  [id number].

## 2012-12-08 NOTE — Brief Op Note (Signed)
50 mcg fentanyl given to Shelley daniel crna to take to the or

## 2012-12-08 NOTE — H&P (Signed)
I have reviewed the H&P, the patient was re-examined, and I have identified no interval changes in medical condition and plan of care since the history and physical of record  

## 2012-12-12 ENCOUNTER — Encounter (HOSPITAL_COMMUNITY): Payer: Self-pay | Admitting: Ophthalmology

## 2014-02-02 HISTORY — PX: POLYPECTOMY: SHX149

## 2014-02-02 HISTORY — PX: COLONOSCOPY: SHX174

## 2014-04-23 ENCOUNTER — Encounter: Payer: Self-pay | Admitting: Internal Medicine

## 2014-06-13 ENCOUNTER — Ambulatory Visit (AMBULATORY_SURGERY_CENTER): Payer: Self-pay

## 2014-06-13 VITALS — Ht 63.0 in | Wt 156.4 lb

## 2014-06-13 DIAGNOSIS — Z1211 Encounter for screening for malignant neoplasm of colon: Secondary | ICD-10-CM

## 2014-06-13 NOTE — Progress Notes (Signed)
No allergies to eggs or soy No diet/weight loss meds No home oxygen No past problems with anesthesia except PONV with general anesthesia  Has email  Emmi instructions given for colonoscopy

## 2014-06-29 ENCOUNTER — Ambulatory Visit (AMBULATORY_SURGERY_CENTER): Payer: PRIVATE HEALTH INSURANCE | Admitting: Internal Medicine

## 2014-06-29 ENCOUNTER — Encounter: Payer: Self-pay | Admitting: Internal Medicine

## 2014-06-29 VITALS — BP 136/77 | HR 79 | Temp 96.5°F | Resp 12

## 2014-06-29 DIAGNOSIS — Z1211 Encounter for screening for malignant neoplasm of colon: Secondary | ICD-10-CM | POA: Diagnosis not present

## 2014-06-29 DIAGNOSIS — D125 Benign neoplasm of sigmoid colon: Secondary | ICD-10-CM

## 2014-06-29 MED ORDER — SODIUM CHLORIDE 0.9 % IV SOLN: 500.0000 mL | INTRAVENOUS | Status: DC

## 2014-06-29 NOTE — Progress Notes (Signed)
Called to room to assist during endoscopic procedure.  Patient ID and intended procedure confirmed with present staff. Received instructions for my participation in the procedure from the performing physician.  

## 2014-06-29 NOTE — Progress Notes (Signed)
A/ox3 pleased with MAC, report to Jane RN 

## 2014-06-29 NOTE — Patient Instructions (Signed)
YOU HAD AN ENDOSCOPIC PROCEDURE TODAY AT [facility]:   Refer to the procedure report that was given to you for any specific questions about what was found during the examination.  If the procedure report does not answer your questions, please call your gastroenterologist to clarify.  If you requested that your care partner not be given the details of your procedure findings, then the procedure report has been included in a sealed envelope for you to review at your convenience later.  YOU SHOULD EXPECT: Some feelings of bloating in the abdomen. Passage of more gas than usual.  Walking can help get rid of the air that was put into your GI tract during the procedure and reduce the bloating. If you had a lower endoscopy (such as a colonoscopy or flexible sigmoidoscopy) you may notice spotting of blood in your stool or on the toilet paper. If you underwent a bowel prep for your procedure, you may not have a normal bowel movement for a few days.  Please Note:  You might notice some irritation and congestion in your nose or some drainage.  This is from the oxygen used during your procedure.  There is no need for concern and it should clear up in a day or so.  SYMPTOMS TO REPORT IMMEDIATELY:   Following lower endoscopy (colonoscopy or flexible sigmoidoscopy):  Excessive amounts of blood in the stool  Significant tenderness or worsening of abdominal pains  Swelling of the abdomen that is new, acute  Fever of 100F or higher   For urgent or emergent issues, a gastroenterologist can be reached at any hour by calling [phone].   DIET: Your first meal following the procedure should be a small meal and then it is ok to progress to your normal diet. Heavy or fried foods are harder to digest and may make you feel nauseous or bloated.  Likewise, meals heavy in dairy and vegetables can increase bloating.  Drink plenty of fluids but you should avoid alcoholic beverages for 24  hours.  ACTIVITY:  You should plan to take it easy for the rest of today and you should NOT DRIVE or use heavy machinery until tomorrow (because of the sedation medicines used during the test).    FOLLOW UP: Our staff will call the number listed on your records the next business day following your procedure to check on you and address any questions or concerns that you may have regarding the information given to you following your procedure. If we do not reach you, we will leave a message.  However, if you are feeling well and you are not experiencing any problems, there is no need to return our call.  We will assume that you have returned to your regular daily activities without incident.  If any biopsies were taken you will be contacted by phone or by letter within the next 1-3 weeks.  Please call us at [phone] if you have not heard about the biopsies in 3 weeks.    SIGNATURES/CONFIDENTIALITY: You and/or your care partner have signed paperwork which will be entered into your electronic medical record.  These signatures attest to the fact that that the information above on your After Visit Summary has been reviewed and is understood.  Full responsibility of the confidentiality of this discharge information lies with you and/or your care-partner.  Polyp information given.  NO ASPIRIN OR ANTI-INFLAMMATORY MEDS FOR 2 WEEKS.

## 2014-06-29 NOTE — Op Note (Signed)
[place]  [address] [place], [id number]   COLONOSCOPY PROCEDURE REPORT  PATIENT: Shelley Jenkins, Shelley Jenkins  MR#: [id number] BIRTHDATE: 1955-02-16 , 58  yrs. old GENDER: female ENDOSCOPIST: Lafayette Dragon, MD REFERRED ZM:[id number] Forde Dandy, M.D. PROCEDURE DATE:  06/29/2014 PROCEDURE:   Colonoscopy, screening and Colonoscopy with snare polypectomy First Screening Colonoscopy - Avg.  risk and is 50 yrs.  old or older Yes.  Prior Negative Screening - Now for repeat screening. N/A  History of Adenoma - Now for follow-up colonoscopy & has been > or = to 3 yrs.  N/A  Polyps removed today? Yes ASA CLASS:   Class I INDICATIONS:Screening for colonic neoplasia and Colorectal Neoplasm Risk Assessment for this procedure is average risk. MEDICATIONS: Monitored anesthesia care and Propofol 200 mg IV  DESCRIPTION OF PROCEDURE:   After the risks benefits and alternatives of the procedure were thoroughly explained, informed consent was obtained.  The digital rectal exam revealed no abnormalities of the rectum.   The LB [id number]  endoscope was introduced through the anus and advanced to the cecum, which was identified by both the appendix and ileocecal valve. No adverse events experienced.   The quality of the prep was good.  (MoviPrep was used)  The instrument was then slowly withdrawn as the colon was fully examined. Estimated blood loss is zero unless otherwise noted in this procedure report.      COLON FINDINGS: A sessile polyp measuring 19 mm in size with a friable surface was found in the sigmoid colon.  A polypectomy was performed using snare cautery.  The resection was complete, the polyp tissue was completely retrieved and sent to histology. Retroflexed views revealed no abnormalities. The time to cecum = 8.30 Withdrawal time = 8.30   The scope was withdrawn and the procedure completed. COMPLICATIONS: There were no immediate complications.  ENDOSCOPIC  IMPRESSION: Sessile polyp was found in the sigmoid colon; polypectomy was performed using snare cautery  RECOMMENDATIONS: 1.  Await pathology results 2.  No aspirin or anti-inflammatory agents for 2 weeks Recall colonoscopy pending path report  eSigned:  Lafayette Dragon, MD 06/29/2014 11:27 AM   cc:

## 2014-07-03 ENCOUNTER — Telehealth: Payer: Self-pay | Admitting: *Deleted

## 2014-07-03 NOTE — Telephone Encounter (Signed)
No answer, left message to call if questions or concerns. 

## 2014-07-09 ENCOUNTER — Encounter: Payer: Self-pay | Admitting: Internal Medicine

## 2014-10-15 ENCOUNTER — Telehealth: Payer: Self-pay | Admitting: Internal Medicine

## 2014-10-15 NOTE — Telephone Encounter (Signed)
Osvaldo Angst, CRNA worked with patient. Left a message for patient to call back.

## 2014-10-15 NOTE — Telephone Encounter (Signed)
Unable to reach patient and unable to leave a message.

## 2014-10-16 NOTE — Telephone Encounter (Signed)
Left a message for patient to call back. 

## 2014-10-17 NOTE — Telephone Encounter (Signed)
Left a message for patient to call back. 

## 2014-10-18 NOTE — Telephone Encounter (Signed)
Patient did not return calls

## 2015-08-05 ENCOUNTER — Other Ambulatory Visit (HOSPITAL_COMMUNITY): Payer: Self-pay | Admitting: Endocrinology

## 2015-08-05 ENCOUNTER — Ambulatory Visit (INDEPENDENT_AMBULATORY_CARE_PROVIDER_SITE_OTHER)
Admission: RE | Admit: 2015-08-05 | Discharge: 2015-08-05 | Disposition: A | Discharge status: Home / Self Care | Payer: PRIVATE HEALTH INSURANCE | Source: Ambulatory Visit | Attending: Vascular Surgery | Admitting: Vascular Surgery

## 2015-08-05 ENCOUNTER — Ambulatory Visit (HOSPITAL_COMMUNITY)
Admission: RE | Admit: 2015-08-05 | Discharge: 2015-08-05 | Disposition: A | Discharge status: Home / Self Care | Payer: PRIVATE HEALTH INSURANCE | Source: Ambulatory Visit | Attending: Vascular Surgery | Admitting: Vascular Surgery

## 2015-08-05 DIAGNOSIS — E119 Type 2 diabetes mellitus without complications: Secondary | ICD-10-CM | POA: Diagnosis not present

## 2015-08-05 DIAGNOSIS — R0989 Other specified symptoms and signs involving the circulatory and respiratory systems: Secondary | ICD-10-CM | POA: Insufficient documentation

## 2015-08-05 DIAGNOSIS — I10 Essential (primary) hypertension: Secondary | ICD-10-CM | POA: Insufficient documentation

## 2015-08-12 ENCOUNTER — Encounter (HOSPITAL_COMMUNITY): Payer: PRIVATE HEALTH INSURANCE

## 2019-09-08 ENCOUNTER — Ambulatory Visit (HOSPITAL_COMMUNITY)
Admission: RE | Admit: 2019-09-08 | Discharge: 2019-09-08 | Disposition: A | Discharge status: Home / Self Care | Payer: Medicare Other | Source: Ambulatory Visit | Attending: Vascular Surgery | Admitting: Vascular Surgery

## 2019-09-08 ENCOUNTER — Other Ambulatory Visit: Payer: Self-pay

## 2019-09-08 ENCOUNTER — Other Ambulatory Visit (HOSPITAL_COMMUNITY): Payer: Self-pay | Admitting: Endocrinology

## 2019-09-08 DIAGNOSIS — R0989 Other specified symptoms and signs involving the circulatory and respiratory systems: Secondary | ICD-10-CM | POA: Diagnosis not present

## 2020-02-08 ENCOUNTER — Encounter: Payer: Self-pay | Admitting: Gastroenterology

## 2020-02-16 ENCOUNTER — Ambulatory Visit (AMBULATORY_SURGERY_CENTER): Payer: Medicare Other

## 2020-02-16 ENCOUNTER — Other Ambulatory Visit: Payer: Self-pay

## 2020-02-16 VITALS — Ht 63.0 in | Wt 137.0 lb

## 2020-02-16 DIAGNOSIS — Z86010 Personal history of colonic polyps: Secondary | ICD-10-CM

## 2020-02-16 MED ORDER — SUTAB 1479-225-188 MG PO TABS: 1.0000 | ORAL_TABLET | ORAL | 0 refills | Status: DC

## 2020-02-16 NOTE — Progress Notes (Signed)
Pre visit completed via phone; patient verified name, DOB, and address; No egg or soy allergy known to patient  No issues with past sedation with any surgeries or procedures No intubation problems in the past  No FH of Malignant Hyperthermia No diet pills per patient No home 02 use per patient  No blood thinners per patient  Pt denies issues with constipation  No A fib or A flutter  COVID 19 guidelines implemented in PV today with Pt and RN  Pt is fully vaccinated  for Covid  Coupon given to pt in PV today , Code to Pharmacy  Due to the COVID-19 pandemic we are asking patients to follow certain guidelines.  Pt aware of COVID protocols and [facility] guidelines

## 2020-03-01 ENCOUNTER — Ambulatory Visit (AMBULATORY_SURGERY_CENTER): Payer: Medicare Other | Admitting: Gastroenterology

## 2020-03-01 ENCOUNTER — Other Ambulatory Visit: Payer: Self-pay

## 2020-03-01 ENCOUNTER — Encounter: Payer: Self-pay | Admitting: Gastroenterology

## 2020-03-01 VITALS — BP 130/75 | HR 80 | Temp 98.4°F | Resp 22 | Ht 63.0 in | Wt 137.0 lb

## 2020-03-01 DIAGNOSIS — D125 Benign neoplasm of sigmoid colon: Secondary | ICD-10-CM

## 2020-03-01 DIAGNOSIS — Z86010 Personal history of colonic polyps: Secondary | ICD-10-CM | POA: Diagnosis not present

## 2020-03-01 DIAGNOSIS — K635 Polyp of colon: Secondary | ICD-10-CM

## 2020-03-01 MED ORDER — SODIUM CHLORIDE 0.9 % IV SOLN: 500.0000 mL | INTRAVENOUS | Status: DC

## 2020-03-01 NOTE — Progress Notes (Signed)
Called to room to assist during endoscopic procedure.  Patient ID and intended procedure confirmed with present staff. Received instructions for my participation in the procedure from the performing physician.  

## 2020-03-01 NOTE — Op Note (Signed)
[place] Patient Name: Shelley Jenkins Procedure Date: 03/01/2020 7:54 AM MRN: [id number] Endoscopist: Thornton Park MD, MD Age: 65 Referring MD:  Date of Birth: 07/22/1955 Gender: Female Account #: [id number] Procedure:                Colonoscopy Indications:              Surveillance: Personal history of adenomatous                            polyps on last colonoscopy > 5 years ago                           Colonoscopy with Dr. Olevia Perches in 2016 revealed a                            tubular adenoma                           She vomiting up the second dose of Subtab prep Medicines:                Monitored Anesthesia Care Procedure:                Pre-Anesthesia Assessment:                           - Prior to the procedure, a History and Physical                            was performed, and patient medications and                            allergies were reviewed. The patient's tolerance of                            previous anesthesia was also reviewed. The risks                            and benefits of the procedure and the sedation                            options and risks were discussed with the patient.                            All questions were answered, and informed consent                            was obtained. Prior Anticoagulants: The patient has                            taken no previous anticoagulant or antiplatelet                            agents. ASA Grade Assessment: II - A patient with  mild systemic disease. After reviewing the risks                            and benefits, the patient was deemed in                            satisfactory condition to undergo the procedure.                           After obtaining informed consent, the colonoscope                            was passed under direct vision. Throughout the                            procedure, the patient's blood pressure, pulse, and                             oxygen saturations were monitored continuously. The                            Olympus CF-HQ190L (Serial# [id number]) Colonoscope was                            introduced through the anus and advanced to the 3                            cm into the ileum. The colonoscopy was performed                            with moderate difficulty due to inadequate bowel                            prep. The prep limited the evaluation for small or                            flat polyps in multiple areas of the colon. The                            patient tolerated the procedure well. The quality                            of the bowel preparation was unsatisfactory. Scope In: 7:59:58 AM Scope Out: 8:15:41 AM Scope Withdrawal Time: 0 hours 8 minutes 59 seconds  Total Procedure Duration: 0 hours 15 minutes 43 seconds  Findings:                 Hemorrhoids were found on perianal exam.                           Skin tags were found on perianal exam.                           A 2 mm polyp was  found in the distal sigmoid colon.                            The polyp was sessile. The polyp was removed with a                            cold snare. Resection and retrieval were complete.                            Estimated blood loss was minimal.                           A moderate amount of stool was found in the entire                            colon, interfering with visualization.                           The exam was otherwise without abnormality on                            direct and retroflexion views. Complications:            No immediate complications. Estimated Blood Loss:     Estimated blood loss: none. Impression:               - Hemorrhoids found on perianal exam.                           - Perianal skin tags found on perianal exam.                           - One 2 mm polyp in the distal sigmoid colon,                            removed with a cold snare. Resected and retrieved.                            - Stool in the entire examined colon limiting an                            evaluation for small or flat polyps.                           - The examination was otherwise normal on direct                            and retroflexion views. Recommendation:           - Patient has a contact number available for                            emergencies. The signs and symptoms of potential  delayed complications were discussed with the                            patient. Return to normal activities tomorrow.                            Written discharge instructions were provided to the                            patient.                           - Resume previous diet.                           - Continue present medications.                           - Await pathology results.                           - Repeat colonoscopy in 12-18 months because the                            bowel preparation was poor. Will plan a different                            prep at that time.                           - Emerging evidence supports eating a diet of                            fruits, vegetables, grains, calcium, and yogurt                            while reducing red meat and alcohol may reduce the                            risk of colon cancer.                           - Thank you for allowing me to be involved in your                            colon cancer prevention. Thornton Park MD, MD 03/01/2020 8:25:33 AM This report has been signed electronically.

## 2020-03-01 NOTE — Patient Instructions (Signed)
Discharge instructions given. ?Handouts on polyps and Hemorrhoids. ?Resume previous medications. ?YOU HAD AN ENDOSCOPIC PROCEDURE TODAY AT THE [facility]:   Refer to the procedure report that was given to you for any specific questions about what was found during the examination.  If the procedure report does not answer your questions, please call your gastroenterologist to clarify.  If you requested that your care partner not be given the details of your procedure findings, then the procedure report has been included in a sealed envelope for you to review at your convenience later. ? ?YOU SHOULD EXPECT: Some feelings of bloating in the abdomen. Passage of more gas than usual.  Walking can help get rid of the air that was put into your GI tract during the procedure and reduce the bloating. If you had a lower endoscopy (such as a colonoscopy or flexible sigmoidoscopy) you may notice spotting of blood in your stool or on the toilet paper. If you underwent a bowel prep for your procedure, you may not have a normal bowel movement for a few days. ? ?Please Note:  You might notice some irritation and congestion in your nose or some drainage.  This is from the oxygen used during your procedure.  There is no need for concern and it should clear up in a day or so. ? ?SYMPTOMS TO REPORT IMMEDIATELY: ? ?Following lower endoscopy (colonoscopy or flexible sigmoidoscopy): ? Excessive amounts of blood in the stool ? Significant tenderness or worsening of abdominal pains ? Swelling of the abdomen that is new, acute ? Fever of 100?F or higher ? ? ?For urgent or emergent issues, a gastroenterologist can be reached at any hour by calling [phone]. ?Do not use MyChart messaging for urgent concerns.  ? ? ?DIET:  We do recommend a small meal at first, but then you may proceed to your regular diet.  Drink plenty of fluids but you should avoid alcoholic beverages for 24 hours. ? ?ACTIVITY:  You should plan to take it  easy for the rest of today and you should NOT DRIVE or use heavy machinery until tomorrow (because of the sedation medicines used during the test).   ? ?FOLLOW UP: ?Our staff will call the number listed on your records 48-72 hours following your procedure to check on you and address any questions or concerns that you may have regarding the information given to you following your procedure. If we do not reach you, we will leave a message.  We will attempt to reach you two times.  During this call, we will ask if you have developed any symptoms of COVID 19. If you develop any symptoms (ie: fever, flu-like symptoms, shortness of breath, cough etc.) before then, please call [phone].  If you test positive for Covid 19 in the 2 weeks post procedure, please call and report this information to us.   ? ?If any biopsies were taken you will be contacted by phone or by letter within the next 1-3 weeks.  Please call us at [phone] if you have not heard about the biopsies in 3 weeks.  ? ? ?SIGNATURES/CONFIDENTIALITY: ?You and/or your care partner have signed paperwork which will be entered into your electronic medical record.  These signatures attest to the fact that that the information above on your After Visit Summary has been reviewed and is understood.  Full responsibility of the confidentiality of this discharge information lies with you and/or your care-partner.  ?

## 2020-03-01 NOTE — Progress Notes (Signed)
Report given to PACU, vss 

## 2020-03-05 ENCOUNTER — Telehealth: Payer: Self-pay

## 2020-03-05 ENCOUNTER — Telehealth: Payer: Self-pay | Admitting: *Deleted

## 2020-03-05 NOTE — Telephone Encounter (Signed)
First post procedure follow up call, no answer 

## 2020-03-05 NOTE — Telephone Encounter (Signed)
  Follow up Call-  Call back number 03/01/2020  Post procedure Call Back phone  # [phone]  Permission to leave phone message Yes  Some recent data might be hidden     Patient questions:  Do you have a fever, pain , or abdominal swelling? No. Pain Score  0 *  Have you tolerated food without any problems? Yes.    Have you been able to return to your normal activities? Yes.    Do you have any questions about your discharge instructions: Diet   No. Medications  No. Follow up visit  No.  Do you have questions or concerns about your Care? No.  Actions: * If pain score is 4 or above: No action needed, pain <4.  1. Have you developed a fever since your procedure? no  2.   Have you had an respiratory symptoms (SOB or cough) since your procedure? no  3.   Have you tested positive for COVID 19 since your procedure no  4.   Have you had any family members/close contacts diagnosed with the COVID 19 since your procedure?  no   If yes to any of these questions please route to Joylene John, RN and Joella Prince, RN

## 2021-04-02 ENCOUNTER — Encounter: Payer: Self-pay | Admitting: Gastroenterology

## 2021-05-30 ENCOUNTER — Other Ambulatory Visit: Payer: Self-pay | Admitting: Urology

## 2021-06-12 NOTE — Patient Instructions (Addendum)
DUE TO COVID-19 ONLY TWO VISITORS  (aged 68 and older)  ARE ALLOWED TO COME WITH YOU AND STAY IN THE WAITING ROOM ONLY DURING PRE OP AND PROCEDURE.   ?**NO VISITORS ARE ALLOWED IN THE SHORT STAY AREA OR RECOVERY ROOM!!** ? ? Your procedure is scheduled on: 06/16/21 ? ? Report to [facility] Main Entrance ? ?  Report to admitting at 8:10 AM ? ? Call this number if you have problems the morning of surgery [phone] ? ? Do not eat food :After Midnight. ? ? After Midnight you may have the following liquids until 7:25 AM DAY OF SURGERY ? ?Water ?Black Coffee (sugar ok, NO MILK/CREAM OR CREAMERS)  ?Tea (sugar ok, NO MILK/CREAM OR CREAMERS) regular and decaf                             ?Plain Jell-O (NO RED)                                           ?Fruit ices (not with fruit pulp, NO RED)                                     ?Popsicles (NO RED)                                                                  ?Juice: apple, WHITE grape, WHITE cranberry ?Sports drinks like Gatorade (NO RED) ?Clear broth(vegetable,chicken,beef) ? ?FOLLOW BOWEL PREP AND ANY ADDITIONAL PRE OP INSTRUCTIONS YOU RECEIVED FROM YOUR SURGEON'S OFFICE!!! ?  ?  ?Oral Hygiene is also important to reduce your risk of infection.                                    ?Remember - BRUSH YOUR TEETH THE MORNING OF SURGERY WITH YOUR REGULAR TOOTHPASTE ? ? Take these medicines the morning of surgery with A SIP OF WATER: Atorvastatin, Lexapro, Levothyroxine, Effexor.  ? ?DO NOT TAKE ANY ORAL DIABETIC MEDICATIONS DAY OF YOUR SURGERY ? ?How to Manage Your Diabetes ?Before and After Surgery ? ?Why is it important to control my blood sugar before and after surgery? ?Improving blood sugar levels before and after surgery helps healing and can limit problems. ?A way of improving blood sugar control is eating a healthy diet by: ? Eating less sugar and carbohydrates ? Increasing activity/exercise ? Talking with your doctor about reaching your blood sugar  goals ?High blood sugars (greater than 180 mg/dL) can raise your risk of infections and slow your recovery, so you will need to focus on controlling your diabetes during the weeks before surgery. ?Make sure that the doctor who takes care of your diabetes knows about your planned surgery including the date and location. ? ?How do I manage my blood sugar before surgery? ?Check your blood sugar at least 4 times a day, starting 2 days before surgery, to make sure that the level is not too high or  low. ?Check your blood sugar the morning of your surgery when you wake up and every 2 hours until you get to the Short Stay unit. ?If your blood sugar is less than 70 mg/dL, you will need to treat for low blood sugar: ?Do not take insulin. ?Treat a low blood sugar (less than 70 mg/dL) with ? cup of clear juice (cranberry or apple), 4 glucose tablets, OR glucose gel. ?Recheck blood sugar in 15 minutes after treatment (to make sure it is greater than 70 mg/dL). If your blood sugar is not greater than 70 mg/dL on recheck, call [phone] for further instructions. ?Report your blood sugar to the short stay nurse when you get to Short Stay. ? ?If you are admitted to the hospital after surgery: ?Your blood sugar will be checked by the staff and you will probably be given insulin after surgery (instead of oral diabetes medicines) to make sure you have good blood sugar levels. ?The goal for blood sugar control after surgery is 80-180 mg/dL. ? ? ?WHAT DO I DO ABOUT MY DIABETES MEDICATION? ? ?Do not take oral diabetes medicines (pills) the morning of surgery. ? ?THE DAY BEFORE SURGERY, take Metformin and Tresiba as prescribed. Do not take Jardiance.     ? ? ?THE MORNING OF SURGERY, do not take Metformin or Jardiance. Take 50% of [place].  ? ?The day of surgery, do not take other diabetes injectables, including Byetta (exenatide), Bydureon (exenatide ER), Victoza (liraglutide), or Trulicity (dulaglutide). ? ?If your CBG is greater than  220 mg/dL, you may take ? of your sliding scale  ?(correction) dose of insulin. ? ?Reviewed and Endorsed by [facility] Patient Education Committee, August 2015  ?                  ?           You may not have any metal on your body including hair pins, jewelry, and body piercing ? ?           Do not wear make-up, lotions, powders, perfumes, or deodorant ? ?Do not wear nail polish including gel and S&S, artificial/acrylic nails, or any other type of covering on natural nails including finger and toenails. If you have artificial nails, gel coating, etc. that needs to be removed by a nail salon please have this removed prior to surgery or surgery may need to be canceled/ delayed if the surgeon/ anesthesia feels like they are unable to be safely monitored.  ? ?Do not shave  48 hours prior to surgery.  ? ? Do not bring valuables to the hospital. [facility] NOT ?            RESPONSIBLE   FOR VALUABLES. ?  ? Patients discharged on the day of surgery will not be allowed to drive home.  Someone NEEDS to stay with you for the first 24 hours after anesthesia. ? ? Special Instructions: Bring a copy of your healthcare power of attorney and living will documents         the day of surgery if you haven't scanned them before. ? ?            Please read over the following fact sheets you were given: IF [place] [phone]- Apolonio Schneiders ? ?   [place] - Preparing for Surgery ?Before surgery, you can play an important role.  Because skin is not sterile, your skin needs to be as free  of germs as possible.  You can reduce the number of germs on your skin by washing with CHG (chlorahexidine gluconate) soap before surgery.  CHG is an antiseptic cleaner which kills germs and bonds with the skin to continue killing germs even after washing. ?Please DO NOT use if you have an allergy to CHG or antibacterial soaps.  If your skin becomes reddened/irritated stop using the CHG and inform  your nurse when you arrive at Short Stay. ?Do not shave (including legs and underarms) for at least 48 hours prior to the first CHG shower.  You may shave your face/neck. ? ?Please follow these instructions carefully: ? 1.  Shower with CHG Soap the night before surgery and the  morning of surgery. ? 2.  If you choose to wash your hair, wash your hair first as usual with your normal  shampoo. ? 3.  After you shampoo, rinse your hair and body thoroughly to remove the shampoo.                            ? 4.  Use CHG as you would any other liquid soap.  You can apply chg directly to the skin and wash.  Gently with a scrungie or clean washcloth. ? 5.  Apply the CHG Soap to your body ONLY FROM THE NECK DOWN.   Do   not use on face/ open      ?                     Wound or open sores. Avoid contact with eyes, ears mouth and   genitals (private parts).  ?                     Production manager,  Genitals (private parts) with your normal soap. ?            6.  Wash thoroughly, paying special attention to the area where your    surgery  will be performed. ? 7.  Thoroughly rinse your body with warm water from the neck down. ? 8.  DO NOT shower/wash with your normal soap after using and rinsing off the CHG Soap. ?               9.  Pat yourself dry with a clean towel. ?           10.  Wear clean pajamas. ?           11.  Place clean sheets on your bed the night of your first shower and do not  sleep with pets. ?Day of Surgery : ?Do not apply any lotions/deodorants the morning of surgery.  Please wear clean clothes to the hospital/surgery center. ? ?FAILURE TO FOLLOW THESE INSTRUCTIONS MAY RESULT IN THE CANCELLATION OF YOUR SURGERY ? ?PATIENT SIGNATURE_________________________________ ? ?NURSE SIGNATURE__________________________________ ? ?________________________________________________________________________  ?

## 2021-06-12 NOTE — Progress Notes (Addendum)
COVID Vaccine Completed: yes x4 ? ?Date of COVID positive in last 90 days: no ? ?PCP - Reynold Bowen, MD ?Cardiologist - n/a ? ?Chest x-ray - n/a ?EKG - 06/13/21 Epic/chart ?Stress Test - n/a ?ECHO - n/a ?Cardiac Cath - n/a ?Pacemaker/ICD device last checked: n/a ?Spinal Cord Stimulator: n/a ? ?Bowel Prep - n/a ? ?Sleep Study - yes, negative ?CPAP -  ? ?Fasting Blood Sugar - 150-275 ?Checks Blood Sugar once every few days ? ?Blood Thinner Instructions: n/a ?Aspirin Instructions: ?Last Dose: ? ?Activity level: Can go up a flight of stairs and perform activities of daily living without stopping and without symptoms of chest pain or shortness of breath. ? ?Patient wants to wait to sign until DOS to discuss some things with surgeon   ? ?Anesthesia review:  ? ?Patient denies shortness of breath, fever, cough and chest pain at PAT appointment ? ? ?Patient verbalized understanding of instructions that were given to them at the PAT appointment. Patient was also instructed that they will need to review over the PAT instructions again at home before surgery.  ?

## 2021-06-13 ENCOUNTER — Encounter (HOSPITAL_COMMUNITY): Payer: Self-pay

## 2021-06-13 ENCOUNTER — Encounter (HOSPITAL_COMMUNITY)
Admission: RE | Admit: 2021-06-13 | Discharge: 2021-06-13 | Disposition: A | Discharge status: Home / Self Care | Payer: Medicare Other | Source: Ambulatory Visit | Attending: Urology | Admitting: Urology

## 2021-06-13 VITALS — BP 141/91 | HR 92 | Temp 98.1°F | Resp 16 | Ht 63.0 in | Wt 140.2 lb

## 2021-06-13 DIAGNOSIS — E119 Type 2 diabetes mellitus without complications: Secondary | ICD-10-CM | POA: Insufficient documentation

## 2021-06-13 DIAGNOSIS — Z01818 Encounter for other preprocedural examination: Secondary | ICD-10-CM | POA: Insufficient documentation

## 2021-06-13 DIAGNOSIS — Z794 Long term (current) use of insulin: Secondary | ICD-10-CM | POA: Insufficient documentation

## 2021-06-13 HISTORY — DX: Other specified postprocedural states: Z98.890

## 2021-06-13 HISTORY — DX: Gastro-esophageal reflux disease without esophagitis: K21.9

## 2021-06-13 HISTORY — DX: Malignant (primary) neoplasm, unspecified: C80.1

## 2021-06-13 HISTORY — DX: Personal history of urinary calculi: Z87.442

## 2021-06-13 LAB — BASIC METABOLIC PANEL
Anion gap: 12 (ref 5–15)
BUN: 16 mg/dL (ref 8–23)
CO2: 26 mmol/L (ref 22–32)
Calcium: 9.4 mg/dL (ref 8.9–10.3)
Chloride: 104 mmol/L (ref 98–111)
Creatinine, Ser: 0.66 mg/dL (ref 0.44–1.00)
GFR, Estimated: >60 mL/min (ref >60)
Glucose, Bld: 75 mg/dL (ref 70–99)
Potassium: 4.2 mmol/L (ref 3.5–5.1)
Sodium: 142 mmol/L (ref 135–145)

## 2021-06-13 LAB — CBC
HCT: 47.3 % — ABNORMAL HIGH (ref 36.0–46.0)
Hemoglobin: 15.8 g/dL — ABNORMAL HIGH (ref 12.0–15.0)
MCH: 32.4 pg (ref 26.0–34.0)
MCHC: 33.4 g/dL (ref 30.0–36.0)
MCV: 96.9 fL (ref 80.0–100.0)
Platelets: 206 10*3/uL (ref 150–400)
RBC: 4.88 MIL/uL (ref 3.87–5.11)
RDW: 12.9 % (ref 11.5–15.5)
WBC: 9.5 10*3/uL (ref 4.0–10.5)
nRBC: 0 % (ref 0.0–0.2)

## 2021-06-13 LAB — HEMOGLOBIN A1C
Hgb A1c MFr Bld: 8 % — ABNORMAL HIGH (ref 4.8–5.6)
Mean Plasma Glucose: 182.9 mg/dL

## 2021-06-13 LAB — GLUCOSE, CAPILLARY: Glucose-Capillary: 83 mg/dL (ref 70–99)

## 2021-06-16 ENCOUNTER — Ambulatory Visit (HOSPITAL_COMMUNITY): Payer: Medicare Other | Admitting: Anesthesiology

## 2021-06-16 ENCOUNTER — Ambulatory Visit (HOSPITAL_COMMUNITY): Payer: Medicare Other

## 2021-06-16 ENCOUNTER — Encounter (HOSPITAL_COMMUNITY)
Admission: RE | Disposition: A | Discharge status: Home / Self Care | Payer: Self-pay | Source: Home / Self Care | Attending: Urology

## 2021-06-16 ENCOUNTER — Ambulatory Visit (HOSPITAL_COMMUNITY)
Admission: RE | Admit: 2021-06-16 | Discharge: 2021-06-16 | Disposition: A | Discharge status: Home / Self Care | Payer: Medicare Other | Attending: Urology | Admitting: Urology

## 2021-06-16 ENCOUNTER — Other Ambulatory Visit: Payer: Self-pay

## 2021-06-16 ENCOUNTER — Encounter (HOSPITAL_COMMUNITY): Payer: Self-pay | Admitting: Urology

## 2021-06-16 ENCOUNTER — Ambulatory Visit (HOSPITAL_BASED_OUTPATIENT_CLINIC_OR_DEPARTMENT_OTHER): Payer: Medicare Other | Admitting: Anesthesiology

## 2021-06-16 DIAGNOSIS — Z7984 Long term (current) use of oral hypoglycemic drugs: Secondary | ICD-10-CM | POA: Diagnosis not present

## 2021-06-16 DIAGNOSIS — C672 Malignant neoplasm of lateral wall of bladder: Secondary | ICD-10-CM | POA: Insufficient documentation

## 2021-06-16 DIAGNOSIS — Z79899 Other long term (current) drug therapy: Secondary | ICD-10-CM | POA: Insufficient documentation

## 2021-06-16 DIAGNOSIS — E119 Type 2 diabetes mellitus without complications: Secondary | ICD-10-CM | POA: Diagnosis not present

## 2021-06-16 DIAGNOSIS — R911 Solitary pulmonary nodule: Secondary | ICD-10-CM | POA: Diagnosis not present

## 2021-06-16 DIAGNOSIS — I10 Essential (primary) hypertension: Secondary | ICD-10-CM | POA: Diagnosis not present

## 2021-06-16 DIAGNOSIS — Z794 Long term (current) use of insulin: Secondary | ICD-10-CM

## 2021-06-16 DIAGNOSIS — M1990 Unspecified osteoarthritis, unspecified site: Secondary | ICD-10-CM | POA: Insufficient documentation

## 2021-06-16 DIAGNOSIS — D494 Neoplasm of unspecified behavior of bladder: Secondary | ICD-10-CM | POA: Diagnosis present

## 2021-06-16 DIAGNOSIS — N200 Calculus of kidney: Secondary | ICD-10-CM | POA: Diagnosis not present

## 2021-06-16 DIAGNOSIS — N3030 Trigonitis without hematuria: Secondary | ICD-10-CM | POA: Insufficient documentation

## 2021-06-16 DIAGNOSIS — T884XXA Failed or difficult intubation, initial encounter: Secondary | ICD-10-CM

## 2021-06-16 HISTORY — PX: CYSTOSCOPY W/ URETERAL STENT PLACEMENT: SHX1429

## 2021-06-16 HISTORY — PX: TRANSURETHRAL RESECTION OF BLADDER TUMOR WITH MITOMYCIN-C: SHX6459

## 2021-06-16 HISTORY — DX: Failed or difficult intubation, initial encounter: T88.4XXA

## 2021-06-16 LAB — GLUCOSE, CAPILLARY: Glucose-Capillary: 149 mg/dL — ABNORMAL HIGH (ref 70–99)

## 2021-06-16 SURGERY — TRANSURETHRAL RESECTION OF BLADDER TUMOR WITH MITOMYCIN-C
Anesthesia: General | Site: Urethra

## 2021-06-16 MED ORDER — SODIUM CHLORIDE 0.9 % IR SOLN
Status: DC | PRN
  Administered 2021-06-16: 1000 mL
  Administered 2021-06-16: 6000 mL via INTRAVESICAL
  Administered 2021-06-16: 3000 mL via INTRAVESICAL

## 2021-06-16 MED ORDER — ORAL CARE MOUTH RINSE: 15.0000 mL | Freq: Once | OROMUCOSAL | Status: AC

## 2021-06-16 MED ORDER — CHLORHEXIDINE GLUCONATE 0.12 % MT SOLN
15.0000 mL | Freq: Once | OROMUCOSAL | Status: AC
  Administered 2021-06-16: 15 mL via OROMUCOSAL

## 2021-06-16 MED ORDER — ONDANSETRON HCL 4 MG/2ML IJ SOLN
INTRAMUSCULAR | Status: DC | PRN
  Administered 2021-06-16: 4 mg via INTRAVENOUS

## 2021-06-16 MED ORDER — FENTANYL CITRATE (PF) 100 MCG/2ML IJ SOLN
INTRAMUSCULAR | Status: DC | PRN
  Administered 2021-06-16 (×2): 50 ug via INTRAVENOUS

## 2021-06-16 MED ORDER — PROPOFOL 10 MG/ML IV BOLUS
INTRAVENOUS | Status: DC | PRN
  Administered 2021-06-16: 150 mg via INTRAVENOUS
  Administered 2021-06-16: 50 mg via INTRAVENOUS

## 2021-06-16 MED ORDER — TAMSULOSIN HCL 0.4 MG PO CAPS: 0.4000 mg | ORAL_CAPSULE | Freq: Every day | ORAL | 1 refills | Status: DC

## 2021-06-16 MED ORDER — LACTATED RINGERS IV SOLN: INTRAVENOUS | Status: DC

## 2021-06-16 MED ORDER — LACTATED RINGERS IV SOLN: INTRAVENOUS | Status: DC | PRN

## 2021-06-16 MED ORDER — AMISULPRIDE (ANTIEMETIC) 5 MG/2ML IV SOLN: 10.0000 mg | Freq: Once | INTRAVENOUS | Status: DC | PRN

## 2021-06-16 MED ORDER — ACETAMINOPHEN 500 MG PO TABS
1000.0000 mg | ORAL_TABLET | Freq: Once | ORAL | Status: AC
  Administered 2021-06-16: 1000 mg via ORAL
  Filled 2021-06-16: qty 2

## 2021-06-16 MED ORDER — FENTANYL CITRATE (PF) 100 MCG/2ML IJ SOLN
INTRAMUSCULAR | Status: AC
  Filled 2021-06-16: qty 2

## 2021-06-16 MED ORDER — FENTANYL CITRATE PF 50 MCG/ML IJ SOSY: 25.0000 ug | PREFILLED_SYRINGE | INTRAMUSCULAR | Status: DC | PRN

## 2021-06-16 MED ORDER — DEXAMETHASONE SODIUM PHOSPHATE 10 MG/ML IJ SOLN
INTRAMUSCULAR | Status: DC | PRN
  Administered 2021-06-16: 10 mg via INTRAVENOUS

## 2021-06-16 MED ORDER — SUGAMMADEX SODIUM 200 MG/2ML IV SOLN
INTRAVENOUS | Status: DC | PRN
  Administered 2021-06-16: 200 mg via INTRAVENOUS

## 2021-06-16 MED ORDER — IOHEXOL 300 MG/ML  SOLN
INTRAMUSCULAR | Status: DC | PRN
  Administered 2021-06-16: 5 mL via URETHRAL

## 2021-06-16 MED ORDER — KETOROLAC TROMETHAMINE 30 MG/ML IJ SOLN
INTRAMUSCULAR | Status: DC | PRN
  Administered 2021-06-16: 30 mg via INTRAVENOUS

## 2021-06-16 MED ORDER — ROCURONIUM BROMIDE 100 MG/10ML IV SOLN
INTRAVENOUS | Status: DC | PRN
  Administered 2021-06-16: 50 mg via INTRAVENOUS

## 2021-06-16 MED ORDER — GEMCITABINE CHEMO FOR BLADDER INSTILLATION 2000 MG
2000.0000 mg | Freq: Once | INTRAVENOUS | Status: DC
  Filled 2021-06-16: qty 52.6

## 2021-06-16 MED ORDER — LIDOCAINE HCL (CARDIAC) PF 100 MG/5ML IV SOSY
PREFILLED_SYRINGE | INTRAVENOUS | Status: DC | PRN
  Administered 2021-06-16: 80 mg via INTRAVENOUS

## 2021-06-16 MED ORDER — OXYBUTYNIN CHLORIDE ER 10 MG PO TB24: 10.0000 mg | ORAL_TABLET | Freq: Every day | ORAL | 1 refills | Status: DC

## 2021-06-16 MED ORDER — EPHEDRINE 5 MG/ML INJ
INTRAVENOUS | Status: AC
  Filled 2021-06-16: qty 5

## 2021-06-16 MED ORDER — CIPROFLOXACIN IN D5W 400 MG/200ML IV SOLN
400.0000 mg | INTRAVENOUS | Status: AC
  Administered 2021-06-16: 400 mg via INTRAVENOUS
  Filled 2021-06-16: qty 200

## 2021-06-16 MED ORDER — OXYCODONE HCL 5 MG PO TABS: 5.0000 mg | ORAL_TABLET | ORAL | 0 refills | Status: DC | PRN

## 2021-06-16 MED ORDER — PROPOFOL 10 MG/ML IV BOLUS
INTRAVENOUS | Status: AC
  Filled 2021-06-16: qty 20

## 2021-06-16 SURGICAL SUPPLY — 26 items
BAG URINE DRAIN 2000ML AR STRL (UROLOGICAL SUPPLIES) ×1 IMPLANT
BAG URO CATCHER STRL LF (MISCELLANEOUS) ×3 IMPLANT
CATH FOLEY 2WAY SLVR  5CC 18FR (CATHETERS) ×1
CATH FOLEY 2WAY SLVR 5CC 18FR (CATHETERS) IMPLANT
CATH URETL OPEN 5X70 (CATHETERS) ×1 IMPLANT
DRAPE FOOT SWITCH (DRAPES) ×3 IMPLANT
ELECT REM PT RETURN 15FT ADLT (MISCELLANEOUS) ×3 IMPLANT
GLOVE BIO SURGEON STRL SZ7.5 (GLOVE) ×3 IMPLANT
GLOVE BIOGEL PI IND STRL 6.5 (GLOVE) IMPLANT
GLOVE BIOGEL PI IND STRL 7.0 (GLOVE) IMPLANT
GLOVE BIOGEL PI INDICATOR 6.5 (GLOVE) ×1
GLOVE BIOGEL PI INDICATOR 7.0 (GLOVE) ×2
GOWN STRL REUS W/ TWL XL LVL3 (GOWN DISPOSABLE) ×2 IMPLANT
GOWN STRL REUS W/TWL XL LVL3 (GOWN DISPOSABLE) ×2
GUIDEWIRE STR DUAL SENSOR (WIRE) ×1 IMPLANT
KIT TURNOVER KIT A (KITS) ×1 IMPLANT
LOOP CUT BIPOLAR 24F LRG (ELECTROSURGICAL) ×1 IMPLANT
MANIFOLD NEPTUNE II (INSTRUMENTS) ×3 IMPLANT
PACK CYSTO (CUSTOM PROCEDURE TRAY) ×3 IMPLANT
PLUG CATH AND CAP STER (CATHETERS) ×1 IMPLANT
STENT URET 6FRX24 CONTOUR (STENTS) ×1 IMPLANT
SYR 10ML LL (SYRINGE) ×1 IMPLANT
SYR TOOMEY IRRIG 70ML (MISCELLANEOUS)
SYRINGE TOOMEY IRRIG 70ML (MISCELLANEOUS) IMPLANT
TUBING CONNECTING 10 (TUBING) ×3 IMPLANT
TUBING UROLOGY SET (TUBING) ×3 IMPLANT

## 2021-06-16 NOTE — Transfer of Care (Signed)
Immediate Anesthesia Transfer of Care Note ? ?Patient: Shelley Jenkins ? ?Procedure(s) Performed: TRANSURETHRAL RESECTION OF BLADDER TUMOR (Urethra) ?CYSTOSCOPY WITH RETROGRADE PYELOGRAM/URETERAL STENT PLACEMENT (Left: Urethra) ? ?Patient Location: PACU ? ?Anesthesia Type:General ? ?Level of Consciousness: awake and drowsy ? ?Airway & Oxygen Therapy: Patient Spontanous Breathing and Patient connected to face mask oxygen ? ?Post-op Assessment: Report given to RN and Post -op Vital signs reviewed and stable ? ?Post vital signs: Reviewed and stable ? ?Last Vitals:  ?Vitals Value Taken Time  ?BP 160/89 06/16/21 1316  ?Temp    ?Pulse 101 06/16/21 1320  ?Resp 22 06/16/21 1320  ?SpO2 89 % 06/16/21 1320  ?Vitals shown include unvalidated device data. ? ?Last Pain:  ?Vitals:  ? 06/16/21 0825  ?TempSrc: Oral  ?PainSc: 0-No pain  ?   ? ?Patients Stated Pain Goal: 2 (06/16/21 0825) ? ?Complications: No notable events documented. ?

## 2021-06-16 NOTE — Anesthesia Postprocedure Evaluation (Signed)
Anesthesia Post Note ? ?Patient: Shelley Jenkins ? ?Procedure(s) Performed: TRANSURETHRAL RESECTION OF BLADDER TUMOR (Urethra) ?CYSTOSCOPY WITH RETROGRADE PYELOGRAM/URETERAL STENT PLACEMENT (Left: Urethra) ? ?  ? ?Patient location during evaluation: PACU ?Anesthesia Type: General ?Level of consciousness: awake and alert ?Pain management: pain level controlled ?Vital Signs Assessment: post-procedure vital signs reviewed and stable ?Respiratory status: spontaneous breathing, nonlabored ventilation, respiratory function stable and patient connected to nasal cannula oxygen ?Cardiovascular status: blood pressure returned to baseline and stable ?Postop Assessment: no apparent nausea or vomiting ?Anesthetic complications: yes (Pt coughing on emergence and sudden trickle of bloody fluid from right ear. Presumed right eardrum rupture given lack of trauma. Discussed with pt follow up with ENT. Contact info provided prior to discharge.) ? ? ?No notable events documented. ? ?Last Vitals:  ?Vitals:  ? 06/16/21 1400 06/16/21 1410  ?BP: 131/81 (!) 146/75  ?Pulse: 95 88  ?Resp: 15   ?Temp: 36.4 ?C   ?SpO2: 96% 95%  ?  ?Last Pain:  ?Vitals:  ? 06/16/21 1410  ?TempSrc:   ?PainSc: 0-No pain  ? ? ?  ?  ?  ?  ?  ?  ? ?Suzette Battiest E ? ? ? ? ?

## 2021-06-16 NOTE — Discharge Instructions (Addendum)
[facility] ?[phone] ?Post Ureteroscopy With or Without Stent Instructions ? ?Definitions: ? ?Ureter: The duct that transports urine from the kidney to the bladder. ?Stent:   A plastic hollow tube that is placed into the ureter, from the kidney to the                 bladder to prevent the ureter from swelling shut. ? ?GENERAL INSTRUCTIONS: ? ?Despite the fact that no skin incisions were used, the area around the ureter and bladder is raw and irritated. The stent is a foreign body which will further irritate the bladder wall. This irritation is manifested by increased frequency of urination, both day and night, and by an increase in the urge to urinate. In some, the urge to urinate is present almost always. Sometimes the urge is strong enough that you may not be able to stop yourself from urinating. The only real cure is to remove the stent and then give time for the bladder wall to heal which can't be done until the danger of the ureter swelling shut has passed, which varies. ? ?You may see some blood in your urine while the stent is in place and a few days afterwards. Do not be alarmed, even if the urine was clear for a while. Get off your feet and drink lots of fluids until clearing occurs. If you start to pass clots or don't improve, call us. ? ?DIET: ?You may return to your normal diet immediately. Because of the raw surface of your bladder, alcohol, spicy foods, acid type foods and drinks with caffeine may cause irritation or frequency and should be used in moderation. To keep your urine flowing freely and to avoid constipation, drink plenty of fluids during the day ( 8-10 glasses ). ?Tip: Avoid cranberry juice because it is very acidic. ? ?ACTIVITY: ?Your physical activity doesn't need to be restricted. However, if you are very active, you may see some blood in your urine. We suggest that you reduce your activity under these circumstances until the bleeding has stopped. ? ?BOWELS: ?It is  important to keep your bowels regular during the postoperative period. Straining with bowel movements can cause bleeding. A bowel movement every other day is reasonable. Use a mild laxative if needed, such as Milk of Magnesia 2-3 tablespoons, or 2 Dulcolax tablets. Call if you continue to have problems. If you have been taking narcotics for pain, before, during or after your surgery, you may be constipated. Take a laxative if necessary. ? ? ?MEDICATION: ?You should resume your pre-surgery medications unless told not to. ?You may take oxybutynin or flomax if prescribed for bladder spasms or discomfort from the stent ?Take pain medication as directed for pain refractory to conservative management ? ?PROBLEMS YOU SHOULD REPORT TO [place]: ?Fevers over 100.5 Fahrenheit. ?Heavy bleeding, or clots ( See above notes about blood in urine ). ?Inability to urinate. ?Drug reactions ( hives, rash, nausea, vomiting, diarrhea ). ?Severe burning or pain with urination that is not improving. ? ?Transurethral Resection of Bladder Tumor (TURBT) or Bladder Biopsy ? ? ?Definition: ? Transurethral Resection of the Bladder Tumor is a surgical procedure used to diagnose and remove tumors within the bladder. TURBT is the most common treatment for early stage bladder cancer. ? ?General instructions: ?   ? Your recent bladder surgery requires very little post hospital care but some definite precautions. ? ?Despite the fact that no skin incisions were used, the area around the bladder incisions are raw and  covered with scabs to promote healing and prevent bleeding. Certain precautions are needed to insure that the scabs are not disturbed over the next 2-4 weeks while the healing proceeds. ? ?Because the raw surface inside your bladder and the irritating effects of urine you may expect frequency of urination and/or urgency (a stronger desire to urinate) and perhaps even getting up at night more often. This will usually resolve or improve slowly  over the healing period. You may see some blood in your urine over the first 6 weeks. Do not be alarmed, even if the urine was clear for a while. Get off your feet and drink lots of fluids until clearing occurs. If you start to pass clots or don't improve call us. ? ?Diet: ? ?You may return to your normal diet immediately. Because of the raw surface of your bladder, alcohol, spicy foods, foods high in acid and drinks with caffeine may cause irritation or frequency and should be used in moderation. To keep your urine flowing freely and avoid constipation, drink plenty of fluids during the day (8-10 glasses). Tip: Avoid cranberry juice because it is very acidic. ? ?Activity: ? ?Your physical activity doesn't need to be restricted. However, if you are very active, you may see some blood in the urine. We suggest that you reduce your activity under the circumstances until the bleeding has stopped. ? ?Bowels: ? ?It is important to keep your bowels regular during the postoperative period. Straining with bowel movements can cause bleeding. A bowel movement every other day is reasonable. Use a mild laxative if needed, such as milk of magnesia 2-3 tablespoons, or 2 Dulcolax tablets. Call if you continue to have problems. If you had been taking narcotics for pain, before, during or after your surgery, you may be constipated. Take a laxative if necessary. ? ? ? ?Medication: ? ?You should resume your pre-surgery medications unless told not to. In addition you may be given an antibiotic to prevent or treat infection. Antibiotics are not always necessary. All medication should be taken as prescribed until the bottles are finished unless you are having an unusual reaction to one of the drugs. ? ? ?

## 2021-06-16 NOTE — H&P (Signed)
CC/HPI: CC: Microscopic hematuria  ?HPI:  ?04/24/2021  ?66 year old female with a history of microscopic hematuria. She has microscopic hematuria today as well. Denies any gross hematuria. No dysuria or voiding complaints. Denies a history of tobacco use. Denies any blood thinner use.  ? ?05/30/2021  ?Patient underwent a CT hematuria protocol. This revealed 2 small nonobstructing left renal calculi. There was no evidence of genitourinary malignancy. She did have a 6 mm right lower lung nodule for which follow-up was recommended. She also had some aortic arteriosclerosis.  ? ?  ?ALLERGIES: Penicillin ?  ? ?MEDICATIONS: Atorvastatin Calcium 20 mg tablet  ?Escitalopram Oxalate 10 mg tablet  ?Jardiance 25 mg tablet  ?Levothyroxine 50 mcg capsule  ?Lorazepam 0.5 mg tablet  ?Losartan Potassium 50 mg tablet  ?Melatonin 10 mg capsule  ?Metformin Er Gastric 500 mg tablet, er gastric retention 24 hr  ?Tyler Aas Flextouch U-100  ?Trulicity 3 XB/3.5 ml pen injector  ?Venlafaxine Hcl Er 150 mg tablet, extended release 24 hr  ?  ? ?GU PSH: Locm 300-'399Mg'$ /Ml Iodine,1Ml - 05/06/2021 ? ?  ? ?NON-GU PSH: Hysterectomy ? ?  ? ?GU PMH: Microscopic hematuria - 05/06/2021, - 04/24/2021 ?  ? ?NON-GU PMH: Anxiety ?GERD ?Hypercholesterolemia ?Hypertension ?  ? ?FAMILY HISTORY: 2 sons - Other ?Death In The Family Father - Other ?Death In The Family Mother - Other  ? ?SOCIAL HISTORY: Marital Status: Married ?Preferred Language: [place]; Ethnicity: Not Hispanic Or Latino; Race: White ?Current Smoking Status: Patient has never smoked.  ? ?Tobacco Use Assessment Completed: Used Tobacco in last 30 days? ?Does not use smokeless tobacco. ?Does not drink caffeine. ?Has not had a blood transfusion. ?  ? ?REVIEW OF SYSTEMS:    ?GU Review Female:   Patient denies frequent urination, hard to postpone urination, burning /pain with urination, get up at night to urinate, leakage of urine, stream starts and stops, trouble starting your stream, have to strain to  urinate, and being pregnant.  ?Gastrointestinal (Upper):   Patient denies nausea, vomiting, and indigestion/ heartburn.  ?Gastrointestinal (Lower):   Patient denies diarrhea and constipation.  ?Constitutional:   Patient denies fatigue, fever, weight loss, and night sweats.  ?Skin:   Patient denies skin rash/ lesion and itching.  ?Eyes:   Patient denies blurred vision and double vision.  ?Ears/ Nose/ Throat:   Patient denies sore throat and sinus problems.  ?Hematologic/Lymphatic:   Patient denies swollen glands and easy bruising.  ?Cardiovascular:   Patient denies leg swelling and chest pains.  ?Respiratory:   Patient denies cough and shortness of breath.  ?Endocrine:   Patient denies excessive thirst.  ?Musculoskeletal:   Patient denies back pain and joint pain.  ?Neurological:   Patient denies headaches and dizziness.  ?Psychologic:   Patient denies depression and anxiety.  ? ?VITAL SIGNS: None  ? ?GU PHYSICAL EXAMINATION:    ?Vagina: Moderate vaginal atrophy. No stenosis. No rectocele. No cystocele. No enterocele.   ? ?MULTI-SYSTEM PHYSICAL EXAMINATION:    ? ?  ?Complexity of Data:  ?Source Of History:  Patient  ?Records Review:   Previous Doctor Records, Previous Patient Records  ?Urine Test Review:   Urinalysis  ?X-Ray Review: C.T. Abdomen/Pelvis: Reviewed Films. Reviewed Report. Discussed With Patient.  ?  ? ?PROCEDURES:    ?     Flexible Cystoscopy - 52000  ?Risks, benefits, and the potential complications of the procedure were discussed with the patient including infection, bleeding, voiding discomfort, urinary retention, etc. All questions were answered. Consent was obtained. Sterile  technique and intraurethral analgesia were used.  ?Meatus:  Normal size. Normal location. Normal condition.  ?Urethra:  Normal urethra  ?Ureteral Orifices:  Normal location. Normal size. Normal shape.   ?Bladder:  No trabeculation. She has about a 3 to 4 cm area of superficial papillary bladder mucosa consistent with  superficial bladder cancer. It is on the anterior bladder wall.  ?  ?  ?The procedure was well-tolerated and without complications. Instructions were given to call the office if she developed any problems. The patient stated that she understood these instructions.  ? ? ?     Urinalysis w/Scope ?Dipstick Dipstick Cont'd Micro  ?Color: Straw Bilirubin: Neg mg/dL WBC/hpf: NS (Not Seen)  ?Appearance: Clear Ketones: Neg mg/dL RBC/hpf: 3 - 10/hpf  ?Specific Gravity: 1.020 Blood: 3+ ery/uL Bacteria: NS (Not Seen)  ?pH: <=5.0 Protein: Neg mg/dL Cystals: NS (Not Seen)  ?Glucose: 2+ mg/dL Urobilinogen: 0.2 mg/dL Casts: NS (Not Seen)  ?  Nitrites: Neg Trichomonas: Not Present  ?  Leukocyte Esterase: Neg leu/uL Mucous: Not Present  ?    Epithelial Cells: 0 - 5/hpf  ?    Yeast: NS (Not Seen)  ?    Sperm: Not Present  ? ? ?ASSESSMENT:  ?    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 ?PLAN:    ? ?      Orders ?Labs Urine Culture  ? ? ?      Document ?Letter(s):  Created for Patient: Clinical Summary  ? ? ?     Notes:   Recommend TURBT with instillation of gemcitabine. Risk benefits discussed including but not limited to bleeding, infection, injury to surrounding structures, bladder perforation, among other imponderables.  ? ?I gave her a copy of her scan. Recommend she establish care with a primary care physician. Emphasized the importance of follow-up for the lung nodule and arteriosclerosis.  ? ? ?Signed by Link Snuffer, III, M.D. on 05/30/21 at 10:09 AM (EDT ?

## 2021-06-16 NOTE — Anesthesia Preprocedure Evaluation (Signed)
Anesthesia Evaluation  ?Patient identified by MRN, date of birth, ID band ?Patient awake ? ? ? ?Reviewed: ?Allergy & Precautions, NPO status , Patient's Chart, lab work & pertinent test results ? ?History of Anesthesia Complications ?(+) history of anesthetic complications ? ?Airway ?Mallampati: II ? ?TM Distance: >3 FB ?Neck ROM: Full ? ? ? Dental ? ?(+) Dental Advisory Given ?  ?Pulmonary ?neg pulmonary ROS,  ?  ?breath sounds clear to auscultation ? ? ? ? ? ? Cardiovascular ?hypertension, Pt. on medications ? ?Rhythm:Regular Rate:Normal ? ? ?  ?Neuro/Psych ?negative neurological ROS ?   ? GI/Hepatic ?Neg liver ROS, GERD  ,  ?Endo/Other  ?diabetes, Type 2, Insulin Dependent, Oral Hypoglycemic AgentsHypothyroidism  ? Renal/GU ?negative Renal ROS  ? ?  ?Musculoskeletal ? ?(+) Arthritis ,  ? Abdominal ?  ?Peds ? Hematology ?negative hematology ROS ?(+)   ?Anesthesia Other Findings ? ? Reproductive/Obstetrics ? ?  ? ? ? ? ? ? ? ? ? ? ? ? ? ?  ?  ? ? ? ? ? ? ? ? ?Lab Results  ?Component Value Date  ? WBC 9.5 06/13/2021  ? HGB 15.8 (H) 06/13/2021  ? HCT 47.3 (H) 06/13/2021  ? MCV 96.9 06/13/2021  ? PLT 206 06/13/2021  ? ?Lab Results  ?Component Value Date  ? CREATININE 0.66 06/13/2021  ? BUN 16 06/13/2021  ? NA 142 06/13/2021  ? K 4.2 06/13/2021  ? CL 104 06/13/2021  ? CO2 26 06/13/2021  ? ? ?Anesthesia Physical ?Anesthesia Plan ? ?ASA: 2 ? ?Anesthesia Plan: General  ? ?Post-op Pain Management: Tylenol PO (pre-op)* and Toradol IV (intra-op)*  ? ?Induction: Intravenous ? ?PONV Risk Score and Plan: 4 or greater and Midazolam, Ondansetron, Dexamethasone and Treatment may vary due to age or medical condition ? ?Airway Management Planned: Oral ETT ? ?Additional Equipment: None ? ?Intra-op Plan:  ? ?Post-operative Plan: Extubation in OR ? ?Informed Consent: I have reviewed the patients History and Physical, chart, labs and discussed the procedure including the risks, benefits and alternatives  for the proposed anesthesia with the patient or authorized representative who has indicated his/her understanding and acceptance.  ? ? ? ?Dental advisory given ? ?Plan Discussed with: CRNA ? ?Anesthesia Plan Comments:   ? ? ? ? ? ? ?Anesthesia Quick Evaluation ? ?

## 2021-06-16 NOTE — Anesthesia Procedure Notes (Signed)
Procedure Name: Intubation ?Date/Time: 06/16/2021 12:13 PM ?Performed by: [place] Ewa Hipp C, CRNA ?Pre-anesthesia Checklist: Patient identified, Emergency Drugs available, Suction available and Patient being monitored ?Patient Re-evaluated:Patient Re-evaluated prior to induction ?Oxygen Delivery Method: Circle system utilized ?Preoxygenation: Pre-oxygenation with 100% oxygen ?Induction Type: IV induction ?Ventilation: Mask ventilation without difficulty ?Laryngoscope Size: Mac and 3 ?Grade View: Grade IV ?Tube type: Oral ?Tube size: 7.5 mm ?Number of attempts: 4 ?Airway Equipment and Method: Stylet, Oral airway, Rigid stylet and Video-laryngoscopy ?Placement Confirmation: ETT inserted through vocal cords under direct vision, positive ETCO2 and breath sounds checked- equal and bilateral ?Secured at: 20 cm ?Tube secured with: Tape ?Dental Injury: Teeth and Oropharynx as per pre-operative assessment  ?Difficulty Due To: Difficulty was unanticipated ?Future Recommendations: Recommend- induction with short-acting agent, and alternative techniques readily available ?Comments: Pt noted to have small mouth opening, no prior anesthesia history in record. Easy mask. DLx 1 by paramedic student, unable to see VC. DL x 1 with Mac 3 by CRNA- placed in esophagus, DL x 1 with miller 2 by MDA, placed in esophagus- all grade 3-4 view. DL with Glidescope mac 3 and rigid stylet, grade 2 view, 7.5 ETT placed. VSS throughout and masked and more propofol and gas given throughout. Slight knick noted on tongue.  ?Recommend Glidescope in future ? ? ? ? ?

## 2021-06-16 NOTE — Op Note (Signed)
Operative Note ? ?Preoperative diagnosis:  ?1.  Bladder tumor ? ?Postoperative diagnosis: ?1.  Bladder tumor--large ? ?Procedure(s): ?1.  Urethral dilation ?2.  Cystoscopy with transurethral resection of bladder tumor--large ?3.  Left diagnostic ureteroscopy with left retrograde pyelogram and left ureteral stent placement ? ?Surgeon: Link Snuffer, MD ? ?Assistants: None ? ?Anesthesia: General ? ?Complications: None immediate ? ?EBL: Minimal ? ?Specimens: ?1.  Bladder tumor ? ?Drains/Catheters: ?1.  6 x 24 double-J ureteral stent, Foley catheter ? ?Intraoperative findings: 1.  Normal urethra 2.  Right ureteral orifice was normal.  Left ureteral orifice was raised and appeared to have papillary mucosa.  For this reason, I felt it was necessary to resect the ureteral orifice.  Also felt it was necessary to place a ureteral stent and perform ureteroscopy to evaluate the distal ureter.  There was no tumor along the ureter. ?3.  Large tumor on the left lateral wall close to the bladder neck extending anteriorly.  With the bladder distended, total area was approximately 5 cm or larger.  Entire area was resected and fulgurated.  There was 1 area of deep resection.  Due to the larger amount of resection and the one area of deeper resection, I left a Foley catheter and opted not to instill gemcitabine. ? ?Indication: 66 year old female with a bladder tumor found on microscopic hematuria work-up. ? ?Description of procedure: ? ?The patient was identified and consent was obtained.  The patient was taken to the operating room and placed in the supine position.  The patient was placed under general anesthesia.  Perioperative antibiotics were administered.  The patient was placed in dorsal lithotomy.  Patient was prepped and draped in a standard sterile fashion and a timeout was performed. ? ?The urethra was a little bit tight.  Therefore, I dilated gently with sounds from 20 [place] up to 28 [place].  I was then able to easily  advance the 59 French resectoscope with visual obturator and placed into the urethra and into the bladder.  I exchanged for the bipolar working element.  I noted there to be irregular bladder mucosa overlying the left ureteral orifice.  I felt it was necessary to resect the ureteral orifice.  I therefore withdrew the scope and advanced a 21 French rigid cystoscope into the bladder.  I advanced a wire up the ureter and then advanced an open-ended ureteral catheter over the wire into the ureter.  I withdrew the scope keeping the open-ended ureteral catheter in place.  I readvanced the 54 French resectoscope into the bladder.  With the working element on bipolar settings, I resected over the ureteral orifice and resected the entire area of irregular mucosa followed by fulguration.  There was no active bleeding noted.  I then turned my attention to the larger tumor.  This was resected on bipolar settings followed by fulguration of the bladder resection bed.  I noted there to be 1 area of deeper resection but no evidence of perforation.  Entire tumor was resected.  Given the large area of resection and the one area that had a deeper layer resected, I elected to leave a Foley catheter at the end. ? ?After resecting the entire tumor, I shot a retrograde pyelogram of the left ureter through the open-ended ureteral catheter.  There was no evidence of filling defect or hydronephrosis.  I advanced a wire through the open-ended ureteral catheter and into the kidney.  A semirigid ureteroscope was advanced alongside the wire up to the renal pelvis.  I performed this to specifically rule out any distal ureteral tumor but went ahead and inspected the entire ureter since it was easy to advance up to the renal pelvis.  There is no evidence of any ureteral involvement.  Distal ureter specifically looked clear of any tumor as did the remainder of the left ureter. ? ?I backloaded the wire onto rigid cystoscope and advanced that into the  bladder followed by routine placement of a 6 x 24 double-J ureteral stent.  Fluoroscopy confirmed proximal placement and direct visualization confirmed a good coil within the bladder. ? ?I collected all bladder chips for specimen.  I reinspected the entire bladder mucosa.  All tumor had been resected.  There was no active bleeding noted.  The single area that was a small area of deeper resection did not appear to be a perforation.  However, as noted above, I elected to leave a Foley catheter and opted against gemcitabine instillation.  Scope was withdrawn and an 65 French Foley catheter was placed.  This concluded the operation.  Patient tolerated the procedure well was stable postoperative. ? ?Plan: She will come back in 3 to 4 days for morning voiding trial.  We will plan to remove the stent in about 1 week or so at her follow-up. ? ?

## 2021-06-17 ENCOUNTER — Encounter (HOSPITAL_COMMUNITY): Payer: Self-pay | Admitting: Urology

## 2021-06-17 LAB — SURGICAL PATHOLOGY

## 2021-07-16 ENCOUNTER — Other Ambulatory Visit: Payer: Self-pay | Admitting: Ophthalmology

## 2021-12-23 ENCOUNTER — Encounter: Payer: Self-pay | Admitting: Gastroenterology

## 2022-01-07 ENCOUNTER — Ambulatory Visit (AMBULATORY_SURGERY_CENTER): Payer: Medicare Other | Admitting: *Deleted

## 2022-01-07 VITALS — Ht 62.5 in | Wt 139.2 lb

## 2022-01-07 DIAGNOSIS — Z86010 Personal history of colonic polyps: Secondary | ICD-10-CM

## 2022-01-07 MED ORDER — NA SULFATE-K SULFATE-MG SULF 17.5-3.13-1.6 GM/177ML PO SOLN: 1.0000 | Freq: Once | ORAL | 0 refills | Status: AC

## 2022-01-07 NOTE — Progress Notes (Signed)
No egg or soy allergy known to patient  PONV hx Patient denies ever being told they had issues or difficulty with intubation  No FH of Malignant Hyperthermia Pt is not on diet pills Pt is not on  home 02  Pt is not on blood thinners  Pt denies issues with constipation  Pt encouraged to use to use Singlecare or Goodrx to reduce cost  Zofran po RX given and pt instructed how to take prior to prep  Patient's chart reviewed by Osvaldo Angst CNRA prior to previsit and patient appropriate for the [employer].  Previsit completed and red dot placed by patient's name on their procedure day (on provider's schedule).

## 2022-01-08 MED ORDER — ONDANSETRON HCL 4 MG PO TABS: ORAL_TABLET | ORAL | 0 refills | Status: DC

## 2022-01-08 NOTE — Addendum Note (Signed)
Addended by: Laverna Peace on: 01/08/2022 06:51 AM   Modules accepted: Orders

## 2022-01-27 ENCOUNTER — Encounter: Payer: Self-pay | Admitting: Certified Registered Nurse Anesthetist

## 2022-01-28 ENCOUNTER — Telehealth: Payer: Self-pay | Admitting: *Deleted

## 2022-01-28 ENCOUNTER — Telehealth: Payer: Self-pay | Admitting: Gastroenterology

## 2022-01-28 ENCOUNTER — Other Ambulatory Visit: Payer: Self-pay

## 2022-01-28 DIAGNOSIS — Z86010 Personal history of colonic polyps: Secondary | ICD-10-CM

## 2022-01-28 NOTE — Telephone Encounter (Signed)
Spoke with patient regarding cancellation of procedure & MD recommendations to move to hospital case. Patient was not very happy with this & requested that it be done by the end of the year d/t financial reasons and since she is already on day 2 of her prep. MD was made aware via secure chat & we were able to add on colon at [facility] for tomorrow 01/29/22 at 8:45 am with Dr. Candis Schatz with his approval. New amb ref placed & pre cert team notified. Patient is aware of changes & is agreeable to move forward with procedure tomorrow. She's been sent an update copy of instructions on mychart, and is aware of new arrival time.

## 2022-01-28 NOTE — Telephone Encounter (Signed)
Patient is calling has questions regarding taking her Zofran before her procedure tomorrow. Please advise

## 2022-01-28 NOTE — Telephone Encounter (Signed)
Patient called in to discuss her colon prep & her concerns that it would not be adequate enough given she is on day 2 of prep and bowel movements are still formed. While we were on the phone, [facility] endo called. Per patient, procedure has been cancelled from tomorrow d/t patient not being off of Trulicity for at least 7 days. Patient is incredibly frustrated given the late notice for the [place] cancellation earlier today, and now the hospital. I apologized again for the late notice & advised her of future hospital dates that were available. Patient stated she would not be returning to our office for care. Will make MD aware.

## 2022-01-28 NOTE — Progress Notes (Signed)
Patient procedure cancelled for 12/28. She had her trulicity injection on Sunday and it should be held for 7 days per Dr. Kalman Shan- anesthesia. Dr. Candis Schatz made aware

## 2022-01-28 NOTE — Telephone Encounter (Signed)
Dr. Tarri Glenn,   This pt is a documented difficult intubation and her procedure will need to be done at the hospital.  Sorry for the late notification.  Thanks,  Osvaldo Angst

## 2022-01-29 ENCOUNTER — Encounter: Payer: Medicare Other | Admitting: Gastroenterology

## 2022-01-29 ENCOUNTER — Encounter (HOSPITAL_COMMUNITY): Admission: RE | Payer: Self-pay | Source: Home / Self Care

## 2022-01-29 ENCOUNTER — Ambulatory Visit (HOSPITAL_COMMUNITY): Admission: RE | Admit: 2022-01-29 | Payer: Medicare Other | Source: Home / Self Care | Admitting: Gastroenterology

## 2022-01-29 SURGERY — COLONOSCOPY WITH PROPOFOL
Anesthesia: Monitor Anesthesia Care

## 2022-03-04 ENCOUNTER — Telehealth: Payer: Self-pay | Admitting: *Deleted

## 2022-03-04 ENCOUNTER — Encounter: Payer: Self-pay | Admitting: *Deleted

## 2022-03-04 NOTE — Telephone Encounter (Signed)
Rec'd referral from patient's PCP for TCS - patient previously had one by Dr Tarri Glenn and was unhappy with that office and asked to be sent somewhere else. Spoke to [place] and we can send questionnaire as patient wasn't going back to previous GI provider

## 2022-03-23 ENCOUNTER — Telehealth (INDEPENDENT_AMBULATORY_CARE_PROVIDER_SITE_OTHER): Payer: Self-pay | Admitting: *Deleted

## 2022-03-23 NOTE — Telephone Encounter (Signed)
  Procedure: Colonoscopy  Height: 5'3 Weight: 137lbs        Have you had a colonoscopy before?  2022 at [facility]  Do you have family history of colon cancer?  no  Do you have a family history of polyps? no  Previous colonoscopy with polyps removed? Yes, 2021  Do you have a history colorectal cancer?   no  Are you diabetic?  Yes type 2  Do you have a prosthetic or mechanical heart valve? no  Do you have a pacemaker/defibrillator?   no  Have you had endocarditis/atrial fibrillation?  no  Do you use supplemental oxygen/CPAP?  no  Have you had joint replacement within the last 12 months?  nono  Do you tend to be constipated or have to use laxatives?  no   Do you have history of alcohol use? If yes, how much and how often.  no  Do you have history or are you using drugs? If yes, what do are you  using?  no  Have you ever had a stroke/heart attack?  no  Have you ever had a heart or other vascular stent placed,?no  Do you take weight loss medication? no  female patients,: have you had a hysterectomy? yes                              are you post menopausal?  no                              do you still have your menstrual cycle? no    Date of last menstrual period? years  Do you take any blood-thinning medications such as: (Plavix, aspirin, Coumadin, Aggrenox, Brilinta, Xarelto, Eliquis, Pradaxa, Savaysa or Effient)? no  If yes we need the name, milligram, dosage and who is prescribing doctor:               Current Outpatient Medications  Medication Sig Dispense Refill   atorvastatin (LIPITOR) 20 MG tablet Take 20 mg by mouth in the morning.     escitalopram (LEXAPRO) 10 MG tablet Take 10 mg by mouth in the morning.     Insulin Degludec (TRESIBA FLEXTOUCH ) Inject 48 Units into the skin in the morning.     JARDIANCE 25 MG TABS tablet Take 25 mg by mouth daily.     levothyroxine (SYNTHROID, LEVOTHROID) 50 MCG tablet Take 50 mcg by mouth daily before breakfast.      LORazepam (ATIVAN) 0.5 MG tablet Take 1 mg by mouth at bedtime.     losartan (COZAAR) 100 MG tablet Take 50 mg by mouth daily.     Melatonin 10 MG TABS Take 10 mg by mouth at bedtime.     metFORMIN (GLUCOPHAGE) 500 MG tablet Take 500 mg by mouth 2 (two) times daily with a meal.     TRULICITY 3 0000000 SOPN Inject 3 mg into the skin every Sunday.     venlafaxine XR (EFFEXOR-XR) 150 MG 24 hr capsule Take 150 mg by mouth 2 (two) times daily.     No current facility-administered medications for this visit.    Allergies  Allergen Reactions   Penicillins Hives

## 2022-03-24 NOTE — Telephone Encounter (Signed)
2022: 2 mm polyp in distal sigmoid, stool in entire colon.  Please keep upcoming appt with Dr. Abbey Chatters on 3/6. Will need to make sure she is prepped well for colonoscopy.

## 2022-04-08 ENCOUNTER — Encounter (INDEPENDENT_AMBULATORY_CARE_PROVIDER_SITE_OTHER): Payer: Self-pay | Admitting: *Deleted

## 2022-04-08 ENCOUNTER — Other Ambulatory Visit (INDEPENDENT_AMBULATORY_CARE_PROVIDER_SITE_OTHER): Payer: Self-pay | Admitting: Internal Medicine

## 2022-04-08 ENCOUNTER — Encounter: Payer: Self-pay | Admitting: Internal Medicine

## 2022-04-08 ENCOUNTER — Ambulatory Visit (INDEPENDENT_AMBULATORY_CARE_PROVIDER_SITE_OTHER): Payer: Medicare Other | Admitting: Internal Medicine

## 2022-04-08 VITALS — BP 121/76 | HR 91 | Temp 98.2°F | Ht 62.5 in | Wt 139.0 lb

## 2022-04-08 DIAGNOSIS — K5904 Chronic idiopathic constipation: Secondary | ICD-10-CM

## 2022-04-08 DIAGNOSIS — D125 Benign neoplasm of sigmoid colon: Secondary | ICD-10-CM | POA: Diagnosis not present

## 2022-04-08 MED ORDER — SUTAB 1479-225-188 MG PO TABS: ORAL_TABLET | ORAL | 0 refills | Status: DC

## 2022-04-08 NOTE — Patient Instructions (Signed)
We will schedule you for colonoscopy for surveillance purposes given your history of polyps prior.  We will use a Sutab prep.  You must need to be a box of Linzess 290 mcg to take 1 tablet daily for 4 days leading up to your colonoscopy.  It was very nice meeting you today.  Dr. Abbey Chatters

## 2022-04-08 NOTE — H&P (View-Only) (Signed)
  Primary Care Physician:  South, Stephen, MD Primary Gastroenterologist:  Dr. Zeev Deakins  Chief Complaint  Patient presents with   Colon Cancer Screening    Last colonoscopy was 03/01/20. Needed repeat in one year due to poor prep. Removed benign lymphoid aggregate removed.     HPI:   Shelley Jenkins is a 67 y.o. female who presents to the clinic today to discuss surveillance colonoscopy.  Colonoscopy 2016 with 2 cm polyp removed from sigmoid colon.  Tubular adenoma on pathology.  Repeat colonoscopy 2022 with poor prep.  Patient states she vomited the prep and knew she was not cleaned out.  Some mild constipation at baseline since starting Trulicity.  No straining.  No rectal bleeding.  No family history of colorectal malignancy.  No unintentional weight loss.  No abdominal pain.    Past Medical History:  Diagnosis Date   Allergy    seasonal allergies   Anxiety    on meds   Arthritis    bilateral hips   Blood transfusion without reported diagnosis    hysterectomy   Cancer (HCC)    bladder   Cataract    bilateral sx   Chronic kidney disease    Depression    on meds   Diabetes mellitus without complication (HCC)    on meds   Difficult airway for intubation, initial encounter 06/16/2021   DLx4 by student, CRNA, & MDA, then glidescope   GERD (gastroesophageal reflux disease)    History of kidney stones    Hypercholesteremia    on meds   Hypertension    on meds   Hypothyroidism    on meds   PONV (postoperative nausea and vomiting)     Past Surgical History:  Procedure Laterality Date   ABDOMINAL HYSTERECTOMY     APPENDECTOMY     BREAST SURGERY Right    CATARACT EXTRACTION W/PHACO Left 11/28/2012   Procedure: CATARACT EXTRACTION PHACO AND INTRAOCULAR LENS PLACEMENT (I[facility]);  Surgeon: Kerry Hunt, MD;  Location: [facility];  Service: Ophthalmology;  Laterality: Left;  CDE:  11.60   CATARACT EXTRACTION W/PHACO Right 12/08/2012   Procedure: CATARACT EXTRACTION PHACO AND INTRAOCULAR  LENS PLACEMENT ([facility]);  Surgeon: Kerry Hunt, MD;  Location: [facility];  Service: Ophthalmology;  Laterality: Right;  CDE:17.33   CHOLECYSTECTOMY     COLONOSCOPY  2016   DB-moviprep (good)-TA x 1   CYSTOSCOPY W/ URETERAL STENT PLACEMENT Left 06/16/2021   Procedure: CYSTOSCOPY WITH RETROGRADE PYELOGRAM/URETERAL STENT PLACEMENT;  Surgeon: Bell, Eugene D III, MD;  Location: [facility];  Service: Urology;  Laterality: Left;   POLYPECTOMY  2016   1 TA   TRANSURETHRAL RESECTION OF BLADDER TUMOR WITH MITOMYCIN-C N/A 06/16/2021   Procedure: TRANSURETHRAL RESECTION OF BLADDER TUMOR;  Surgeon: Bell, Eugene D III, MD;  Location: [facility];  Service: Urology;  Laterality: N/A;  1 HR FOR CASE    Current Outpatient Medications  Medication Sig Dispense Refill   atorvastatin (LIPITOR) 20 MG tablet Take 20 mg by mouth in the morning.     escitalopram (LEXAPRO) 10 MG tablet Take 10 mg by mouth in the morning.     Insulin Degludec (TRESIBA FLEXTOUCH [place]) Inject 48 Units into the skin in the morning.     JARDIANCE 25 MG TABS tablet Take 25 mg by mouth daily.     levothyroxine (SYNTHROID, LEVOTHROID) 50 MCG tablet Take 50 mcg by mouth daily before breakfast.     LORazepam (ATIVAN) 0.5 MG tablet Take 1 mg by   mouth at bedtime.     losartan (COZAAR) 100 MG tablet Take 50 mg by mouth daily.     Melatonin 10 MG TABS Take 10 mg by mouth at bedtime.     metFORMIN (GLUCOPHAGE) 500 MG tablet Take 500 mg by mouth 2 (two) times daily with a meal.     TRULICITY 3 MG/0.5ML SOPN Inject 3 mg into the skin every Sunday.     venlafaxine XR (EFFEXOR-XR) 150 MG 24 hr capsule Take 150 mg by mouth 2 (two) times daily.     No current facility-administered medications for this visit.    Allergies as of 04/08/2022 - Review Complete 04/08/2022  Allergen Reaction Noted   Penicillins Hives 11/11/2012    Family History  Problem Relation Age of Onset   Stomach cancer Maternal Grandmother        ?stomach CA-unknown type   Colon cancer Neg Hx     Colon polyps Neg Hx    Esophageal cancer Neg Hx    Rectal cancer Neg Hx     Social History   Socioeconomic History   Marital status: Married    Spouse name: Not on file   Number of children: Not on file   Years of education: Not on file   Highest education level: Not on file  Occupational History   Not on file  Tobacco Use   Smoking status: Never    Passive exposure: Never   Smokeless tobacco: Never  Vaping Use   Vaping Use: Never used  Substance and Sexual Activity   Alcohol use: No   Drug use: No   Sexual activity: Yes    Birth control/protection: Surgical  Other Topics Concern   Not on file  Social History Narrative   Not on file   Social Determinants of Health   Financial Resource Strain: Not on file  Food Insecurity: Not on file  Transportation Needs: Not on file  Physical Activity: Not on file  Stress: Not on file  Social Connections: Not on file  Intimate Partner Violence: Not on file    Subjective: Review of Systems  Constitutional:  Negative for chills and fever.  HENT:  Negative for congestion and hearing loss.   Eyes:  Negative for blurred vision and double vision.  Respiratory:  Negative for cough and shortness of breath.   Cardiovascular:  Negative for chest pain and palpitations.  Gastrointestinal:  Negative for abdominal pain, blood in stool, constipation, diarrhea, heartburn, melena and vomiting.  Genitourinary:  Negative for dysuria and urgency.  Musculoskeletal:  Negative for joint pain and myalgias.  Skin:  Negative for itching and rash.  Neurological:  Negative for dizziness and headaches.  Psychiatric/Behavioral:  Negative for depression. The patient is not nervous/anxious.        Objective: BP 121/76   Pulse 91   Temp 98.2 F (36.8 C) (Oral)   Ht 5' 2.5" (1.588 m)   Wt 139 lb (63 kg)   BMI 25.02 kg/m  Physical Exam Constitutional:      Appearance: Normal appearance.  HENT:     Head: Normocephalic and atraumatic.  Eyes:      Extraocular Movements: Extraocular movements intact.     Conjunctiva/sclera: Conjunctivae normal.  Cardiovascular:     Rate and Rhythm: Normal rate and regular rhythm.  Pulmonary:     Effort: Pulmonary effort is normal.     Breath sounds: Normal breath sounds.  Abdominal:     General: Bowel sounds are normal.       Palpations: Abdomen is soft.  Musculoskeletal:        General: No swelling. Normal range of motion.     Cervical back: Normal range of motion and neck supple.  Skin:    General: Skin is warm and dry.     Coloration: Skin is not jaundiced.  Neurological:     General: No focal deficit present.     Mental Status: She is alert and oriented to person, place, and time.  Psychiatric:        Mood and Affect: Mood normal.        Behavior: Behavior normal.      Assessment: *Adenomatous colon polyps *Constipation-mild  Plan: Will schedule for surveillance colonoscopy.The risks including infection, bleed, or perforation as well as benefits, limitations, alternatives and imponderables have been reviewed with the patient. Questions have been answered. All parties agreeable.  Linzess 290 mcg x4 days leading up to colonoscopy.    04/08/2022 10:17 AM   Disclaimer: This note was dictated with voice recognition software. Similar sounding words can inadvertently be transcribed and may not be corrected upon review.  

## 2022-04-08 NOTE — Progress Notes (Signed)
Primary Care Physician:  Reynold Bowen, MD Primary Gastroenterologist:  Dr. Abbey Chatters  Chief Complaint  Patient presents with   Colon Cancer Screening    Last colonoscopy was 03/01/20. Needed repeat in one year due to poor prep. Removed benign lymphoid aggregate removed.     HPI:   Shelley Jenkins is a 67 y.o. female who presents to the clinic today to discuss surveillance colonoscopy.  Colonoscopy 2016 with 2 cm polyp removed from sigmoid colon.  Tubular adenoma on pathology.  Repeat colonoscopy 2022 with poor prep.  Patient states she vomited the prep and knew she was not cleaned out.  Some mild constipation at baseline since starting Trulicity.  No straining.  No rectal bleeding.  No family history of colorectal malignancy.  No unintentional weight loss.  No abdominal pain.    Past Medical History:  Diagnosis Date   Allergy    seasonal allergies   Anxiety    on meds   Arthritis    bilateral hips   Blood transfusion without reported diagnosis    hysterectomy   Cancer ([place])    bladder   Cataract    bilateral sx   Chronic kidney disease    Depression    on meds   Diabetes mellitus without complication ([place])    on meds   Difficult airway for intubation, initial encounter 06/16/2021   DLx4 by student, CRNA, & MDA, then glidescope   GERD (gastroesophageal reflux disease)    History of kidney stones    Hypercholesteremia    on meds   Hypertension    on meds   Hypothyroidism    on meds   PONV (postoperative nausea and vomiting)     Past Surgical History:  Procedure Laterality Date   ABDOMINAL HYSTERECTOMY     APPENDECTOMY     BREAST SURGERY Right    CATARACT EXTRACTION W/PHACO Left 11/28/2012   Procedure: CATARACT EXTRACTION PHACO AND INTRAOCULAR LENS PLACEMENT ([place]);  Surgeon: Tonny Branch, MD;  Location: [facility];  Service: Ophthalmology;  Laterality: Left;  CDE:  11.60   CATARACT EXTRACTION W/PHACO Right 12/08/2012   Procedure: CATARACT EXTRACTION PHACO AND INTRAOCULAR  LENS PLACEMENT ([facility]);  Surgeon: Tonny Branch, MD;  Location: [facility];  Service: Ophthalmology;  Laterality: Right;  CDE:17.33   CHOLECYSTECTOMY     COLONOSCOPY  2016   DB-moviprep (good)-TA x 1   CYSTOSCOPY W/ URETERAL STENT PLACEMENT Left 06/16/2021   Procedure: CYSTOSCOPY WITH RETROGRADE PYELOGRAM/URETERAL STENT PLACEMENT;  Surgeon: Lucas Mallow, MD;  Location: [facility];  Service: Urology;  Laterality: Left;   POLYPECTOMY  2016   1 TA   TRANSURETHRAL RESECTION OF BLADDER TUMOR WITH MITOMYCIN-C N/A 06/16/2021   Procedure: TRANSURETHRAL RESECTION OF BLADDER TUMOR;  Surgeon: Lucas Mallow, MD;  Location: [facility];  Service: Urology;  Laterality: N/A;  1 HR FOR CASE    Current Outpatient Medications  Medication Sig Dispense Refill   atorvastatin (LIPITOR) 20 MG tablet Take 20 mg by mouth in the morning.     escitalopram (LEXAPRO) 10 MG tablet Take 10 mg by mouth in the morning.     Insulin Degludec (TRESIBA FLEXTOUCH [place]) Inject 48 Units into the skin in the morning.     JARDIANCE 25 MG TABS tablet Take 25 mg by mouth daily.     levothyroxine (SYNTHROID, LEVOTHROID) 50 MCG tablet Take 50 mcg by mouth daily before breakfast.     LORazepam (ATIVAN) 0.5 MG tablet Take 1 mg by  mouth at bedtime.     losartan (COZAAR) 100 MG tablet Take 50 mg by mouth daily.     Melatonin 10 MG TABS Take 10 mg by mouth at bedtime.     metFORMIN (GLUCOPHAGE) 500 MG tablet Take 500 mg by mouth 2 (two) times daily with a meal.     TRULICITY 3 0000000 SOPN Inject 3 mg into the skin every Sunday.     venlafaxine XR (EFFEXOR-XR) 150 MG 24 hr capsule Take 150 mg by mouth 2 (two) times daily.     No current facility-administered medications for this visit.    Allergies as of 04/08/2022 - Review Complete 04/08/2022  Allergen Reaction Noted   Penicillins Hives 11/11/2012    Family History  Problem Relation Age of Onset   Stomach cancer Maternal Grandmother        ?stomach CA-unknown type   Colon cancer Neg Hx     Colon polyps Neg Hx    Esophageal cancer Neg Hx    Rectal cancer Neg Hx     Social History   Socioeconomic History   Marital status: Married    Spouse name: Not on file   Number of children: Not on file   Years of education: Not on file   Highest education level: Not on file  Occupational History   Not on file  Tobacco Use   Smoking status: Never    Passive exposure: Never   Smokeless tobacco: Never  Vaping Use   Vaping Use: Never used  Substance and Sexual Activity   Alcohol use: No   Drug use: No   Sexual activity: Yes    Birth control/protection: Surgical  Other Topics Concern   Not on file  Social History Narrative   Not on file   Social Determinants of Health   Financial Resource Strain: Not on file  Food Insecurity: Not on file  Transportation Needs: Not on file  Physical Activity: Not on file  Stress: Not on file  Social Connections: Not on file  Intimate Partner Violence: Not on file    Subjective: Review of Systems  Constitutional:  Negative for chills and fever.  HENT:  Negative for congestion and hearing loss.   Eyes:  Negative for blurred vision and double vision.  Respiratory:  Negative for cough and shortness of breath.   Cardiovascular:  Negative for chest pain and palpitations.  Gastrointestinal:  Negative for abdominal pain, blood in stool, constipation, diarrhea, heartburn, melena and vomiting.  Genitourinary:  Negative for dysuria and urgency.  Musculoskeletal:  Negative for joint pain and myalgias.  Skin:  Negative for itching and rash.  Neurological:  Negative for dizziness and headaches.  Psychiatric/Behavioral:  Negative for depression. The patient is not nervous/anxious.        Objective: BP 121/76   Pulse 91   Temp 98.2 F (36.8 C) (Oral)   Ht 5' 2.5" (1.588 m)   Wt 139 lb (63 kg)   BMI 25.02 kg/m  Physical Exam Constitutional:      Appearance: Normal appearance.  HENT:     Head: Normocephalic and atraumatic.  Eyes:      Extraocular Movements: Extraocular movements intact.     Conjunctiva/sclera: Conjunctivae normal.  Cardiovascular:     Rate and Rhythm: Normal rate and regular rhythm.  Pulmonary:     Effort: Pulmonary effort is normal.     Breath sounds: Normal breath sounds.  Abdominal:     General: Bowel sounds are normal.  Palpations: Abdomen is soft.  Musculoskeletal:        General: No swelling. Normal range of motion.     Cervical back: Normal range of motion and neck supple.  Skin:    General: Skin is warm and dry.     Coloration: Skin is not jaundiced.  Neurological:     General: No focal deficit present.     Mental Status: She is alert and oriented to person, place, and time.  Psychiatric:        Mood and Affect: Mood normal.        Behavior: Behavior normal.      Assessment: *Adenomatous colon polyps *Constipation-mild  Plan: Will schedule for surveillance colonoscopy.The risks including infection, bleed, or perforation as well as benefits, limitations, alternatives and imponderables have been reviewed with the patient. Questions have been answered. All parties agreeable.  Linzess 290 mcg x4 days leading up to colonoscopy.    04/08/2022 10:17 AM   Disclaimer: This note was dictated with voice recognition software. Similar sounding words can inadvertently be transcribed and may not be corrected upon review.

## 2022-05-05 ENCOUNTER — Ambulatory Visit (HOSPITAL_COMMUNITY)
Admission: RE | Admit: 2022-05-05 | Discharge: 2022-05-05 | Disposition: A | Discharge status: Home / Self Care | Payer: Medicare Other | Source: Ambulatory Visit | Attending: Internal Medicine | Admitting: Internal Medicine

## 2022-05-05 ENCOUNTER — Other Ambulatory Visit: Payer: Self-pay

## 2022-05-05 ENCOUNTER — Ambulatory Visit (HOSPITAL_BASED_OUTPATIENT_CLINIC_OR_DEPARTMENT_OTHER): Payer: Medicare Other | Admitting: Certified Registered Nurse Anesthetist

## 2022-05-05 ENCOUNTER — Ambulatory Visit (HOSPITAL_COMMUNITY): Payer: Medicare Other | Admitting: Certified Registered Nurse Anesthetist

## 2022-05-05 ENCOUNTER — Encounter (HOSPITAL_COMMUNITY)
Admission: RE | Disposition: A | Discharge status: Home / Self Care | Payer: Self-pay | Source: Ambulatory Visit | Attending: Internal Medicine

## 2022-05-05 ENCOUNTER — Encounter (HOSPITAL_COMMUNITY): Payer: Self-pay

## 2022-05-05 DIAGNOSIS — K6389 Other specified diseases of intestine: Secondary | ICD-10-CM | POA: Diagnosis not present

## 2022-05-05 DIAGNOSIS — Z7985 Long-term (current) use of injectable non-insulin antidiabetic drugs: Secondary | ICD-10-CM | POA: Diagnosis not present

## 2022-05-05 DIAGNOSIS — Z86010 Personal history of colonic polyps: Secondary | ICD-10-CM | POA: Diagnosis present

## 2022-05-05 DIAGNOSIS — F419 Anxiety disorder, unspecified: Secondary | ICD-10-CM | POA: Diagnosis not present

## 2022-05-05 DIAGNOSIS — Z7984 Long term (current) use of oral hypoglycemic drugs: Secondary | ICD-10-CM | POA: Diagnosis not present

## 2022-05-05 DIAGNOSIS — Z794 Long term (current) use of insulin: Secondary | ICD-10-CM | POA: Insufficient documentation

## 2022-05-05 DIAGNOSIS — I129 Hypertensive chronic kidney disease with stage 1 through stage 4 chronic kidney disease, or unspecified chronic kidney disease: Secondary | ICD-10-CM | POA: Insufficient documentation

## 2022-05-05 DIAGNOSIS — E039 Hypothyroidism, unspecified: Secondary | ICD-10-CM | POA: Insufficient documentation

## 2022-05-05 DIAGNOSIS — N189 Chronic kidney disease, unspecified: Secondary | ICD-10-CM | POA: Diagnosis not present

## 2022-05-05 DIAGNOSIS — F32A Depression, unspecified: Secondary | ICD-10-CM | POA: Insufficient documentation

## 2022-05-05 DIAGNOSIS — Z1211 Encounter for screening for malignant neoplasm of colon: Secondary | ICD-10-CM | POA: Insufficient documentation

## 2022-05-05 DIAGNOSIS — D120 Benign neoplasm of cecum: Secondary | ICD-10-CM | POA: Diagnosis not present

## 2022-05-05 DIAGNOSIS — E1122 Type 2 diabetes mellitus with diabetic chronic kidney disease: Secondary | ICD-10-CM | POA: Insufficient documentation

## 2022-05-05 HISTORY — PX: BIOPSY: SHX5522

## 2022-05-05 HISTORY — PX: COLONOSCOPY WITH PROPOFOL: SHX5780

## 2022-05-05 HISTORY — PX: POLYPECTOMY: SHX5525

## 2022-05-05 LAB — GLUCOSE, CAPILLARY
Glucose-Capillary: 89 mg/dL (ref 70–99)
Glucose-Capillary: 97 mg/dL (ref 70–99)

## 2022-05-05 SURGERY — COLONOSCOPY WITH PROPOFOL
Anesthesia: General

## 2022-05-05 MED ORDER — LINACLOTIDE 290 MCG PO CAPS: 290.0000 ug | ORAL_CAPSULE | Freq: Every day | ORAL | 3 refills | Status: DC

## 2022-05-05 MED ORDER — LIDOCAINE HCL (CARDIAC) PF 100 MG/5ML IV SOSY
PREFILLED_SYRINGE | INTRAVENOUS | Status: DC | PRN
  Administered 2022-05-05: 50 mg via INTRAVENOUS

## 2022-05-05 MED ORDER — LACTATED RINGERS IV SOLN: INTRAVENOUS | Status: DC

## 2022-05-05 MED ORDER — PROPOFOL 10 MG/ML IV BOLUS
INTRAVENOUS | Status: DC | PRN
  Administered 2022-05-05 (×3): 50 mg via INTRAVENOUS
  Administered 2022-05-05: 80 mg via INTRAVENOUS

## 2022-05-05 MED ORDER — LACTATED RINGERS IV SOLN: INTRAVENOUS | Status: DC | PRN

## 2022-05-05 NOTE — Transfer of Care (Signed)
Immediate Anesthesia Transfer of Care Note  Patient: Shelley Jenkins  Procedure(s) Performed: COLONOSCOPY WITH PROPOFOL BIOPSY POLYPECTOMY  Patient Location: Endoscopy Unit  Anesthesia Type:General  Level of Consciousness: awake, alert , and oriented  Airway & Oxygen Therapy: Patient Spontanous Breathing and Patient connected to nasal cannula oxygen  Post-op Assessment: Report given to RN and Post -op Vital signs reviewed and stable  Post vital signs: Reviewed and stable  Last Vitals:  Vitals Value Taken Time  BP 130/81 05/05/22 0845  Temp 36.6 C 05/05/22 0845  Pulse 92 05/05/22 0845  Resp    SpO2 91 % 05/05/22 0845    Last Pain:  Vitals:   05/05/22 0845  TempSrc: Oral  PainSc: 0-No pain      Patients Stated Pain Goal: 6 ([id number])  Complications: No notable events documented.

## 2022-05-05 NOTE — Anesthesia Preprocedure Evaluation (Signed)
Anesthesia Evaluation  Patient identified by MRN, date of birth, ID band Patient awake    Reviewed: Allergy & Precautions, H&P , NPO status , Patient's Chart, lab work & pertinent test results, reviewed documented beta blocker date and time   History of Anesthesia Complications (+) PONV, DIFFICULT AIRWAY and history of anesthetic complications (Easy intubation with glidescope)  Airway Mallampati: II  TM Distance: >3 FB Neck ROM: full    Dental no notable dental hx.    Pulmonary neg pulmonary ROS   Pulmonary exam normal breath sounds clear to auscultation       Cardiovascular Exercise Tolerance: Good hypertension, negative cardio ROS  Rhythm:regular Rate:Normal     Neuro/Psych  PSYCHIATRIC DISORDERS Anxiety Depression    negative neurological ROS  negative psych ROS   GI/Hepatic negative GI ROS, Neg liver ROS,GERD  ,,  Endo/Other  negative endocrine ROSdiabetesHypothyroidism    Renal/GU Renal diseasenegative Renal ROS  negative genitourinary   Musculoskeletal   Abdominal   Peds  Hematology negative hematology ROS (+)   Anesthesia Other Findings   Reproductive/Obstetrics negative OB ROS                             Anesthesia Physical Anesthesia Plan  ASA: 2  Anesthesia Plan: General   Post-op Pain Management:    Induction:   PONV Risk Score and Plan: Propofol infusion  Airway Management Planned:   Additional Equipment:   Intra-op Plan:   Post-operative Plan:   Informed Consent: I have reviewed the patients History and Physical, chart, labs and discussed the procedure including the risks, benefits and alternatives for the proposed anesthesia with the patient or authorized representative who has indicated his/her understanding and acceptance.     Dental Advisory Given  Plan Discussed with: CRNA  Anesthesia Plan Comments:        Anesthesia Quick Evaluation

## 2022-05-05 NOTE — Discharge Instructions (Addendum)
  Colonoscopy Discharge Instructions  Read the instructions outlined below and refer to this sheet in the next few weeks. These discharge instructions provide you with general information on caring for yourself after you leave the hospital. Your doctor may also give you specific instructions. While your treatment has been planned according to the most current medical practices available, unavoidable complications occasionally occur.   ACTIVITY You may resume your regular activity, but move at a slower pace for the next 24 hours.  Take frequent rest periods for the next 24 hours.  Walking will help get rid of the air and reduce the bloated feeling in your belly (abdomen).  No driving for 24 hours (because of the medicine (anesthesia) used during the test).   Do not sign any important legal documents or operate any machinery for 24 hours (because of the anesthesia used during the test).  NUTRITION Drink plenty of fluids.  You may resume your normal diet as instructed by your doctor.  Begin with a light meal and progress to your normal diet. Heavy or fried foods are harder to digest and may make you feel sick to your stomach (nauseated).  Avoid alcoholic beverages for 24 hours or as instructed.  MEDICATIONS You may resume your normal medications unless your doctor tells you otherwise.  WHAT YOU CAN EXPECT TODAY Some feelings of bloating in the abdomen.  Passage of more gas than usual.  Spotting of blood in your stool or on the toilet paper.  IF YOU HAD POLYPS REMOVED DURING THE COLONOSCOPY: No aspirin products for 7 days or as instructed.  No alcohol for 7 days or as instructed.  Eat a soft diet for the next 24 hours.  FINDING OUT THE RESULTS OF YOUR TEST Not all test results are available during your visit. If your test results are not back during the visit, make an appointment with your caregiver to find out the results. Do not assume everything is normal if you have not heard from your  caregiver or the medical facility. It is important for you to follow up on all of your test results.  SEEK IMMEDIATE MEDICAL ATTENTION IF: You have more than a spotting of blood in your stool.  Your belly is swollen (abdominal distention).  You are nauseated or vomiting.  You have a temperature over 101.  You have abdominal pain or discomfort that is severe or gets worse throughout the day.   Unfortunately, your colon was not adequately prepped today for colonoscopy. I did remove 1 polyp, we will call with results. I also took samples of your colon as well as the ride side appeared somewhat nodular.    Would recommend repeat colonoscopy in 6 months with a different colon prep.  Follow up in GI clinic in 3 months to discuss further   I hope you have a great rest of your week!  Elon Alas. Abbey Chatters, D.O. Gastroenterology and Hepatology [facility]

## 2022-05-05 NOTE — Op Note (Signed)
[facility] Patient Name: Shelley Jenkins Procedure Date: 05/05/2022 8:06 AM MRN: [id number] Date of Birth: [birth year] Attending MD: Elon Alas. Abbey Chatters , [place], [id number] CSN: [id number] Age: 67 Admit Type: Outpatient Procedure:                Colonoscopy Indications:              Surveillance: Personal history of colonic polyps                            with unknown histology on last colonoscopy                            (inadequate bowel preparation) less than 3 years ago Providers:                Elon Alas. Abbey Chatters, DO, Caprice Kluver, [place], Technician Referring MD:              Medicines:                See the Anesthesia note for documentation of the                            administered medications Complications:            No immediate complications. Estimated Blood Loss:     Estimated blood loss was minimal. Procedure:                Pre-Anesthesia Assessment:                           - The anesthesia plan was to use monitored                            anesthesia care (MAC).                           After obtaining informed consent, the colonoscope                            was passed under direct vision. Throughout the                            procedure, the patient's blood pressure, pulse, and                            oxygen saturations were monitored continuously. The                            [id number]-[id number]) scope was introduced through                            the anus and advanced to the the cecum, identified                            by appendiceal orifice  and ileocecal valve. The                            colonoscopy was technically difficult and complex                            due to inadequate bowel prep. The patient tolerated                            the procedure well. The quality of the bowel                            preparation was evaluated using the BBPS [facility]                            Bowel  Preparation Scale) with scores of: Right                            Colon = 1 (portion of mucosa seen, but other areas                            not well seen due to staining, residual stool                            and/or opaque liquid), Transverse Colon = 1                            (portion of mucosa seen, but other areas not well                            seen due to staining, residual stool and/or opaque                            liquid) and Left Colon = 1 (portion of mucosa seen,                            but other areas not well seen due to staining,                            residual stool and/or opaque liquid). The total                            BBPS score equals 3. The quality of the bowel                            preparation was inadequate. Scope In: 8:19:39 AM Scope Out: 8:39:42 AM Scope Withdrawal Time: 0 hours 13 minutes 7 seconds  Total Procedure Duration: 0 hours 20 minutes 3 seconds  Findings:      A 4 mm polyp was found in the cecum. The polyp was sessile. The polyp       was removed with a cold snare. Resection and retrieval were complete.      A localized area  of mildly nodular mucosa was found in the cecum.       Biopsies were taken with a cold forceps for histology.      Extensive amounts of semi-solid stool was found in the entire colon,       precluding visualization. Lavage of the area was performed using copious       amounts of sterile water, resulting in incomplete clearance with       continued poor visualization. Impression:               - Preparation of the colon was inadequate.                           - One 4 mm polyp in the cecum, removed with a cold                            snare. Resected and retrieved.                           - Nodular mucosa in the cecum. Biopsied.                           - Stool in the entire examined colon. Moderate Sedation:      Per Anesthesia Care Recommendation:           - Patient has a contact number  available for                            emergencies. The signs and symptoms of potential                            delayed complications were discussed with the                            patient. Return to normal activities tomorrow.                            Written discharge instructions were provided to the                            patient.                           - Resume previous diet.                           - Continue present medications.                           - Await pathology results.                           - Repeat colonoscopy in 6 months because the bowel                            preparation was poor. PATIENT WITH DIFFICULT  AIRWAY, WILL NEED TO BE ASA 3                           - Return to GI clinic in 3 months. Procedure Code(s):        --- Professional ---                           [phone], Colonoscopy, flexible; with removal of                            tumor(s), polyp(s), or other lesion(s) by snare                            technique                           [id number], 82, Colonoscopy, flexible; with biopsy,                            single or multiple Diagnosis Code(s):        --- Professional ---                           Z86.010, Personal history of colonic polyps                           D12.0, Benign neoplasm of cecum                           K63.89, Other specified diseases of intestine CPT copyright 2022 American Medical Association. All rights reserved. The codes documented in this report are preliminary and upon coder review may  be revised to meet current compliance requirements. Elon Alas. Abbey Chatters, DO [place] Abbey Chatters, DO 05/05/2022 8:49:44 AM This report has been signed electronically. Number of Addenda: 0

## 2022-05-05 NOTE — Interval H&P Note (Signed)
History and Physical Interval Note:  05/05/2022 7:39 AM  Shelley Jenkins  has presented today for surgery, with the diagnosis of hx polyps, constipation.  The various methods of treatment have been discussed with the patient and family. After consideration of risks, benefits and other options for treatment, the patient has consented to  Procedure(s) with comments: COLONOSCOPY WITH PROPOFOL (N/A) - 11:00am, asa 2, pt knows to arrive at 6:30 as a surgical intervention.  The patient's history has been reviewed, patient examined, no change in status, stable for surgery.  I have reviewed the patient's chart and labs.  Questions were answered to the patient's satisfaction.     Eloise Harman

## 2022-05-06 LAB — SURGICAL PATHOLOGY

## 2022-05-07 NOTE — Anesthesia Postprocedure Evaluation (Signed)
Anesthesia Post Note  Patient: Shelley Jenkins  Procedure(s) Performed: COLONOSCOPY WITH PROPOFOL BIOPSY POLYPECTOMY  Patient location during evaluation: Phase II Anesthesia Type: General Level of consciousness: awake Pain management: pain level controlled Vital Signs Assessment: post-procedure vital signs reviewed and stable Respiratory status: spontaneous breathing and respiratory function stable Cardiovascular status: blood pressure returned to baseline and stable Postop Assessment: no headache and no apparent nausea or vomiting Anesthetic complications: no Comments: Late entry   No notable events documented.   Last Vitals:  Vitals:   05/05/22 0845 05/05/22 0849  BP: 130/81 128/88  Pulse: 92 90  Resp:  13  Temp: 36.6 C   SpO2: 91% 94%    Last Pain:  Vitals:   05/05/22 0849  TempSrc:   PainSc: 0-No pain                 Louann Sjogren

## 2022-05-11 ENCOUNTER — Telehealth: Payer: Self-pay

## 2022-05-11 NOTE — Telephone Encounter (Signed)
Pt phoned today stating that since her colonoscopy she has only had one BM and that was this morning. She stated it was very little. The colon prep did not clean her out either. I advised the pt to continue taking her Linzess until I hear back from you regarding what she needs to do. Pt expressed understanding.

## 2022-05-12 ENCOUNTER — Encounter (HOSPITAL_COMMUNITY): Payer: Self-pay | Admitting: Internal Medicine

## 2022-05-15 NOTE — Telephone Encounter (Signed)
Continue on Linzess 290 mcg daily.  Would add on MiraLAX 2 capfuls daily to this as well and see how she does.  Thank you

## 2022-05-15 NOTE — Telephone Encounter (Signed)
Phoned and advised the pt of the instructions for her constipation. Pt expressed understanding

## 2022-07-07 ENCOUNTER — Encounter: Payer: Self-pay | Admitting: Internal Medicine

## 2022-09-08 ENCOUNTER — Telehealth: Payer: Self-pay | Admitting: Internal Medicine

## 2022-09-08 ENCOUNTER — Telehealth: Payer: Self-pay

## 2022-09-08 NOTE — Telephone Encounter (Signed)
error 

## 2022-09-08 NOTE — Telephone Encounter (Signed)
Phoned the pt, no ans. LMOVM for the pt to return call

## 2022-09-08 NOTE — Telephone Encounter (Signed)
Patient came into the office and said that Dr. Marletta Lor did her last colonoscopy and it didn't work.  He gave her a medication that she usually takes at night and she goes to the bathroom every other day or every 2 days.  She doesn't know if she needs to continue taking that and does she need a repeat colonoscopy.  Please advise.

## 2022-09-11 NOTE — Telephone Encounter (Signed)
Dr Marletta Lor I called the pt this morning and spoke with her regarding her coming by the office the other day. Pt was concerned about "not wanting to have a repeat colonoscopy " until something is done concerning her bowel habits. Pt states she has always been a person who only had a BM every 2 or 3 days maybe but its been worse over past 3 years. She had taken the Linzess as instructed and still the prep was not good enough. Pt advised she cn eat fruit but she doesn't. She does not like any green foods. Her main fluid intake is decaffeinated un sweet ice tea. She really wants to get the bowel prep together and working in her system before she attempts another colonoscopy. Pt states she followed your instructions of the Linzess to a T, but still that did not help with this colonoscopy. Please advise

## 2022-09-17 NOTE — Telephone Encounter (Signed)
Lets get her in for an OV with app or myself.  Thank you

## 2022-09-18 NOTE — Telephone Encounter (Signed)
Per Dr Marletta Lor please schedule pt appt to be seen with him or APP

## 2022-09-18 NOTE — Telephone Encounter (Signed)
noted 

## 2022-09-22 ENCOUNTER — Other Ambulatory Visit: Payer: Self-pay | Admitting: Urology

## 2022-09-23 ENCOUNTER — Encounter (HOSPITAL_BASED_OUTPATIENT_CLINIC_OR_DEPARTMENT_OTHER): Payer: Self-pay | Admitting: Urology

## 2022-09-23 ENCOUNTER — Other Ambulatory Visit: Payer: Self-pay

## 2022-09-23 NOTE — Progress Notes (Addendum)
Spoke w/ via phone for pre-op interview---pt Lab needs dos----   I stat, ekg            Lab results------ COVID test -----patient states asymptomatic no test needed Arrive at -------730 am 09-28-2022 NPO after MN NO Solid Food.  Clear liquids from MN until---630 Med rec completed Medications to take morning of surgery -----levothyroxine, venlafaxine, atorvastatin Diabetic medication -----lasat dose trulicity was 09-21-2022, no diabetic meds day of surgery Patient instructed no nail polish to be worn day of surgery Patient instructed to bring photo id and insurance card day of surgery Patient aware to have Driver (ride ) frined [employer] / caregiver    for 24 hours after surgery husband larry Patient Special Instructions -----none Pre-Op special Instructions -----none Patient verbalized understanding of instructions that were given at this phone interview. Patient denies shortness of breath, chest pain, fever, cough at this phone interview.  Reviewed anesthesia history from 06-16-2021 with dr  r ellander mda, pt ok for [facility] surgery  09-28-2022 per dr r ellander mda

## 2022-09-28 ENCOUNTER — Ambulatory Visit (HOSPITAL_BASED_OUTPATIENT_CLINIC_OR_DEPARTMENT_OTHER): Payer: Medicare Other | Admitting: Anesthesiology

## 2022-09-28 ENCOUNTER — Encounter (HOSPITAL_BASED_OUTPATIENT_CLINIC_OR_DEPARTMENT_OTHER): Payer: Self-pay | Admitting: Urology

## 2022-09-28 ENCOUNTER — Other Ambulatory Visit: Payer: Self-pay

## 2022-09-28 ENCOUNTER — Ambulatory Visit (HOSPITAL_BASED_OUTPATIENT_CLINIC_OR_DEPARTMENT_OTHER)
Admission: RE | Admit: 2022-09-28 | Discharge: 2022-09-28 | Disposition: A | Discharge status: Home / Self Care | Payer: Medicare Other | Attending: Urology | Admitting: Urology

## 2022-09-28 ENCOUNTER — Encounter (HOSPITAL_BASED_OUTPATIENT_CLINIC_OR_DEPARTMENT_OTHER)
Admission: RE | Disposition: A | Discharge status: Home / Self Care | Payer: Self-pay | Source: Home / Self Care | Attending: Urology

## 2022-09-28 DIAGNOSIS — Z8551 Personal history of malignant neoplasm of bladder: Secondary | ICD-10-CM | POA: Diagnosis not present

## 2022-09-28 DIAGNOSIS — I10 Essential (primary) hypertension: Secondary | ICD-10-CM | POA: Diagnosis not present

## 2022-09-28 DIAGNOSIS — Z7984 Long term (current) use of oral hypoglycemic drugs: Secondary | ICD-10-CM | POA: Insufficient documentation

## 2022-09-28 DIAGNOSIS — Z794 Long term (current) use of insulin: Secondary | ICD-10-CM | POA: Insufficient documentation

## 2022-09-28 DIAGNOSIS — E119 Type 2 diabetes mellitus without complications: Secondary | ICD-10-CM | POA: Insufficient documentation

## 2022-09-28 DIAGNOSIS — C679 Malignant neoplasm of bladder, unspecified: Secondary | ICD-10-CM | POA: Diagnosis not present

## 2022-09-28 DIAGNOSIS — K219 Gastro-esophageal reflux disease without esophagitis: Secondary | ICD-10-CM | POA: Insufficient documentation

## 2022-09-28 DIAGNOSIS — Z01818 Encounter for other preprocedural examination: Secondary | ICD-10-CM

## 2022-09-28 DIAGNOSIS — D494 Neoplasm of unspecified behavior of bladder: Secondary | ICD-10-CM | POA: Diagnosis present

## 2022-09-28 HISTORY — PX: CYSTOSCOPY W/ RETROGRADES: SHX1426

## 2022-09-28 LAB — POCT I-STAT, CHEM 8
BUN: 21 mg/dL (ref 8–23)
Calcium, Ion: 1.26 mmol/L (ref 1.15–1.40)
Chloride: 104 mmol/L (ref 98–111)
Creatinine, Ser: 0.6 mg/dL (ref 0.44–1.00)
Glucose, Bld: 118 mg/dL — ABNORMAL HIGH (ref 70–99)
HCT: 48 % — ABNORMAL HIGH (ref 36.0–46.0)
Hemoglobin: 16.3 g/dL — ABNORMAL HIGH (ref 12.0–15.0)
Potassium: 4.1 mmol/L (ref 3.5–5.1)
Sodium: 142 mmol/L (ref 135–145)
TCO2: 23 mmol/L (ref 22–32)

## 2022-09-28 LAB — GLUCOSE, CAPILLARY: Glucose-Capillary: 143 mg/dL — ABNORMAL HIGH (ref 70–99)

## 2022-09-28 SURGERY — TURBT, WITH CHEMOTHERAPEUTIC AGENT INSTILLATION INTO BLADDER
Anesthesia: General | Site: Ureter

## 2022-09-28 MED ORDER — LIDOCAINE 2% (20 MG/ML) 5 ML SYRINGE
INTRAMUSCULAR | Status: DC | PRN
  Administered 2022-09-28: 100 mg via INTRAVENOUS

## 2022-09-28 MED ORDER — FENTANYL CITRATE (PF) 100 MCG/2ML IJ SOLN: 25.0000 ug | INTRAMUSCULAR | Status: DC | PRN

## 2022-09-28 MED ORDER — ONDANSETRON HCL 4 MG/2ML IJ SOLN
INTRAMUSCULAR | Status: DC | PRN
  Administered 2022-09-28: 4 mg via INTRAVENOUS

## 2022-09-28 MED ORDER — GEMCITABINE CHEMO FOR BLADDER INSTILLATION 2000 MG
2000.0000 mg | Freq: Once | INTRAVENOUS | Status: AC
  Administered 2022-09-28: 2000 mg via INTRAVESICAL
  Filled 2022-09-28: qty 2000

## 2022-09-28 MED ORDER — CIPROFLOXACIN IN D5W 400 MG/200ML IV SOLN
INTRAVENOUS | Status: AC
  Filled 2022-09-28: qty 200

## 2022-09-28 MED ORDER — MIDAZOLAM HCL 2 MG/2ML IJ SOLN
INTRAMUSCULAR | Status: DC | PRN
  Administered 2022-09-28 (×2): 1 mg via INTRAVENOUS

## 2022-09-28 MED ORDER — MIDAZOLAM HCL 2 MG/2ML IJ SOLN
INTRAMUSCULAR | Status: AC
  Filled 2022-09-28: qty 2

## 2022-09-28 MED ORDER — PROPOFOL 10 MG/ML IV BOLUS
INTRAVENOUS | Status: AC
  Filled 2022-09-28: qty 20

## 2022-09-28 MED ORDER — FENTANYL CITRATE (PF) 100 MCG/2ML IJ SOLN
INTRAMUSCULAR | Status: AC
  Filled 2022-09-28: qty 2

## 2022-09-28 MED ORDER — DEXAMETHASONE SODIUM PHOSPHATE 10 MG/ML IJ SOLN
INTRAMUSCULAR | Status: DC | PRN
  Administered 2022-09-28: 10 mg via INTRAVENOUS

## 2022-09-28 MED ORDER — IOHEXOL 300 MG/ML  SOLN
INTRAMUSCULAR | Status: DC | PRN
  Administered 2022-09-28: 25 mL via URETHRAL

## 2022-09-28 MED ORDER — OXYCODONE HCL 5 MG/5ML PO SOLN: 5.0000 mg | Freq: Once | ORAL | Status: DC | PRN

## 2022-09-28 MED ORDER — PROPOFOL 500 MG/50ML IV EMUL
INTRAVENOUS | Status: DC | PRN
  Administered 2022-09-28: 180 ug/kg/min via INTRAVENOUS

## 2022-09-28 MED ORDER — SODIUM CHLORIDE 0.9 % IR SOLN
Status: DC | PRN
  Administered 2022-09-28: 3000 mL

## 2022-09-28 MED ORDER — ACETAMINOPHEN 10 MG/ML IV SOLN: 1000.0000 mg | Freq: Once | INTRAVENOUS | Status: DC | PRN

## 2022-09-28 MED ORDER — ONDANSETRON HCL 4 MG/2ML IJ SOLN: 4.0000 mg | Freq: Once | INTRAMUSCULAR | Status: DC | PRN

## 2022-09-28 MED ORDER — LACTATED RINGERS IV SOLN: INTRAVENOUS | Status: DC

## 2022-09-28 MED ORDER — OXYCODONE HCL 5 MG PO TABS: 5.0000 mg | ORAL_TABLET | Freq: Once | ORAL | Status: DC | PRN

## 2022-09-28 MED ORDER — KETOROLAC TROMETHAMINE 30 MG/ML IJ SOLN
INTRAMUSCULAR | Status: DC | PRN
  Administered 2022-09-28: 30 mg via INTRAVENOUS

## 2022-09-28 MED ORDER — CIPROFLOXACIN IN D5W 400 MG/200ML IV SOLN
INTRAVENOUS | Status: DC | PRN
  Administered 2022-09-28: 400 mg via INTRAVENOUS

## 2022-09-28 MED ORDER — FENTANYL CITRATE (PF) 250 MCG/5ML IJ SOLN
INTRAMUSCULAR | Status: DC | PRN
  Administered 2022-09-28 (×2): 25 ug via INTRAVENOUS
  Administered 2022-09-28: 50 ug via INTRAVENOUS

## 2022-09-28 MED ORDER — HYDROCODONE-ACETAMINOPHEN 5-325 MG PO TABS: 1.0000 | ORAL_TABLET | ORAL | 0 refills | Status: DC | PRN

## 2022-09-28 MED ORDER — SUCCINYLCHOLINE 20MG/ML (10ML) SYRINGE FOR MEDFUSION PUMP - OPTIME
INTRAMUSCULAR | Status: DC | PRN
Start: 2022-09-28 — End: 2022-09-28
  Administered 2022-09-28: 40 mg via INTRAVENOUS

## 2022-09-28 MED ORDER — PROPOFOL 10 MG/ML IV BOLUS
INTRAVENOUS | Status: DC | PRN
  Administered 2022-09-28: 140 mg via INTRAVENOUS

## 2022-09-28 SURGICAL SUPPLY — 32 items
BAG DRAIN URO-CYSTO SKYTR STRL (DRAIN) ×2 IMPLANT
BAG DRN RND TRDRP ANRFLXCHMBR (UROLOGICAL SUPPLIES)
BAG DRN UROCATH (DRAIN) ×2
BAG URINE DRAIN 2000ML AR STRL (UROLOGICAL SUPPLIES) IMPLANT
BAG URINE LEG 500ML (DRAIN) IMPLANT
CATH FOLEY 2WAY SLVR 30CC 20FR (CATHETERS) IMPLANT
CATH FOLEY 2WAY SLVR 5CC 18FR (CATHETERS) IMPLANT
CATH FOLEY 2WAY SLVR 5CC 22FR (CATHETERS) IMPLANT
CATH URETL OPEN END 6FR 70 (CATHETERS) IMPLANT
CLOTH BEACON ORANGE TIMEOUT ST (SAFETY) ×2 IMPLANT
ELECT REM PT RETURN 9FT ADLT (ELECTROSURGICAL)
ELECTRODE REM PT RTRN 9FT ADLT (ELECTROSURGICAL) IMPLANT
EVACUATOR MICROVAS BLADDER (UROLOGICAL SUPPLIES) IMPLANT
GLOVE BIO SURGEON STRL SZ7.5 (GLOVE) ×2 IMPLANT
GOWN STRL REUS W/TWL LRG LVL3 (GOWN DISPOSABLE) ×2 IMPLANT
GOWN STRL REUS W/TWL XL LVL3 (GOWN DISPOSABLE) IMPLANT
GUIDEWIRE STR DUAL SENSOR (WIRE) ×2 IMPLANT
HOLDER FOLEY CATH W/STRAP (MISCELLANEOUS) IMPLANT
IV NS IRRIG 3000ML ARTHROMATIC (IV SOLUTION) ×2 IMPLANT
KIT TURNOVER CYSTO (KITS) ×2 IMPLANT
LASER FIB FLEXIVA PULSE ID 365 (Laser) IMPLANT
LOOP CUT BIPOLAR 24F LRG (ELECTROSURGICAL) IMPLANT
MANIFOLD NEPTUNE II (INSTRUMENTS) ×2 IMPLANT
NS IRRIG 500ML POUR BTL (IV SOLUTION) ×2 IMPLANT
PACK CYSTO (CUSTOM PROCEDURE TRAY) ×2 IMPLANT
SLEEVE SCD COMPRESS KNEE MED (STOCKING) ×2 IMPLANT
SYR TOOMEY IRRIG 70ML (MISCELLANEOUS) ×2
SYRINGE TOOMEY IRRIG 70ML (MISCELLANEOUS) IMPLANT
TRACTIP FLEXIVA PULS ID 200XHI (Laser) IMPLANT
TRACTIP FLEXIVA PULSE ID 200 (Laser)
TUBE CONNECTING 12X1/4 (SUCTIONS) IMPLANT
TUBING UROLOGY SET (TUBING) ×2 IMPLANT

## 2022-09-28 NOTE — Anesthesia Procedure Notes (Signed)
Procedure Name: LMA Insertion Date/Time: 09/28/2022 8:36 AM  Performed by: Dairl Ponder, CRNAPre-anesthesia Checklist: Patient identified, Emergency Drugs available, Suction available and Patient being monitored Patient Re-evaluated:Patient Re-evaluated prior to induction Oxygen Delivery Method: Circle System Utilized Preoxygenation: Pre-oxygenation with 100% oxygen Induction Type: IV induction Ventilation: Mask ventilation without difficulty LMA: LMA inserted LMA Size: 3.0 Number of attempts: 1 Airway Equipment and Method: Bite block Placement Confirmation: positive ETCO2 Tube secured with: Tape Dental Injury: Teeth and Oropharynx as per pre-operative assessment

## 2022-09-28 NOTE — Anesthesia Postprocedure Evaluation (Signed)
Anesthesia Post Note  Patient: Jannifer Rodney  Procedure(s) Performed: TRANSURETHRAL RESECTION OF BLADDER TUMOR (TURBT) with GEMCITABINE (Bladder) CYSTOSCOPY WITH BILATERAL RETROGRADE PYELOGRAM (Bilateral: Ureter)     Patient location during evaluation: PACU Anesthesia Type: General Level of consciousness: awake and alert Pain management: pain level controlled Vital Signs Assessment: post-procedure vital signs reviewed and stable Respiratory status: spontaneous breathing, nonlabored ventilation, respiratory function stable and patient connected to nasal cannula oxygen Cardiovascular status: blood pressure returned to baseline and stable Postop Assessment: no apparent nausea or vomiting Anesthetic complications: no   No notable events documented.  Last Vitals:  Vitals:   09/28/22 1000 09/28/22 1015  BP: 118/70 118/61  Pulse: 94 90  Resp: 14 17  Temp:    SpO2: 94% 94%    Last Pain:  Vitals:   09/28/22 1015  TempSrc:   PainSc: 0-No pain                 Mariann Barter

## 2022-09-28 NOTE — Interval H&P Note (Signed)
History and Physical Interval Note:  09/28/2022 7:33 AM  Shelley Jenkins  has presented today for surgery, with the diagnosis of BLADDER TUMOR.  The various methods of treatment have been discussed with the patient and family. After consideration of risks, benefits and other options for treatment, the patient has consented to  Procedure(s) with comments: TRANSURETHRAL RESECTION OF BLADDER TUMOR (TURBT) with GEMCITABINE (N/A) CYSTOSCOPY WITH BILATERAL RETROGRADE PYELOGRAM (Bilateral) - 45 MINS FOR THIS CASE as a surgical intervention.  The patient's history has been reviewed, patient examined, no change in status, stable for surgery.  I have reviewed the patient's chart and labs.  Questions were answered to the patient's satisfaction.     Ray Church, III

## 2022-09-28 NOTE — Progress Notes (Signed)
Dr Alvester Morin called to come talk with patient and her friend about the surgery.  MD arrived and spoke with patient and family.

## 2022-09-28 NOTE — Op Note (Signed)
Operative Note  Preoperative diagnosis:  1.  Bladder tumor  Postoperative diagnosis: 1.  Bladder tumor-medium  Procedure(s): 1.  Cystoscopy with bilateral retrograde pyelogram 2.  Transurethral resection of bladder tumor-medium 3.  Intravesical instillation of gemcitabine   Surgeon: Modena Slater, MD  Assistants: None  Anesthesia: General  Complications: None immediate  EBL: Minimal  Specimens: 1.  Bladder tumor  Drains/Catheters: 1.  18 French Foley catheter  Intraoperative findings: 1.  Normal urethra 2.  Bladder mucosa with evidence of prior resection over the left ureteral orifice.  On the posterior bladder wall towards the left she had very superficial papillary mucosa consistent with possible superficial low-grade bladder tumor this was resected and fulgurated.  Area of resection and fulguration was approximately 2 cm. 3.  Left retrograde pyelogram revealed evidence of fullness from the kidney down to the level of the ureterovesicular junction where there was no tumor.  There was evidence of prior resection.  There was brisk E flux of contrast after which is consistent with history of reflux after resecting her left ureteral orifice.  There was no filling defect to suggest a tumor within the ureter 4.  Right retrograde pyelogram without any evidence of hydronephrosis or filling defect.  Indication: 67 year old female with history of bladder cancer status post TURBT in the past with a small recurrence presents for the previously mentioned operation.  Description of procedure:  The patient was identified and consent was obtained.  The patient was taken to the operating room and placed in the supine position.  The patient was placed under general anesthesia.  Perioperative antibiotics were administered.  The patient was placed in dorsal lithotomy.  Patient was prepped and draped in a standard sterile fashion and a timeout was performed.  A 21 French rigid cystoscope was  advanced into the urethra and into the bladder.  Complete cystoscopy was performed with findings noted above.  Right ureteral orifice was intubated with an open ureteral catheter and a retrograde pyelogram was performed with findings noted above.  The left ureter was a little more difficult to locate.  It was more lateral and had evidence of prior resection over it.  I was able to pass a wire into the ureteral orifice and then advance the open-ended ureteral catheter into the ureteral orifice.  I withdrew the wire and shot a retrograde pyelogram with findings noted above.  I did note there to be some fullness throughout the entire collecting system down to the level of the ureterovesicular junction without any evidence of filling defect.  There is no tumor at the ureterovesicular junction and there was brisk E flux of contrast after removal of the open-ended ureteral catheter.  Findings are consistent with reflux following her prior resection over the ureteral orifice.  There is no evidence of any stricturing.  I exchanged for the 26 French resectoscope with a visual obturator in place advanced that into the bladder followed by exchanging for the working element and proceeded to resect the tumor on bipolar settings followed by fulguration of the resection bed and surrounding tissue.  All tumor was destroyed.  There is no evidence of any perforation or bleeding.  I collected the specimen and this concluded the operation.  I placed a Foley catheter.  Patient tolerated the procedure well and was stable postoperatively.  In the PACU, instilled gemcitabine into the bladder where it remained for approximately 1 hour prior to proper disposal.  Plan: Follow-up in 1 week for pathology review

## 2022-09-28 NOTE — H&P (Signed)
CC/HPI: CC: Low-grade bladder cancer  HPI:  04/24/2021  67 year old female with a history of microscopic hematuria. She has microscopic hematuria today as well. Denies any gross hematuria. No dysuria or voiding complaints. Denies a history of tobacco use. Denies any blood thinner use.   05/30/2021  Patient underwent a CT hematuria protocol. This revealed 2 small nonobstructing left renal calculi. There was no evidence of genitourinary malignancy. She did have a 6 mm right lower lung nodule for which follow-up was recommended. She also had some aortic arteriosclerosis.   06/24/2021  Patient status post TURBT. With the bladder distended, she actually had a large involvement of her bladder, look-alike about 5 cm or more. Tumor appeared all superficial. It looks like I resected all the tumor but it was a larger resection. 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 Muscularis was present and not involved. There was 1 area of deep resection therefore I elected against gemcitabine. Also, there was some tumor overlying the left ureteral orifice. I performed a left ureteroscopy which was negative and also placed a ureteral stent to protect the ureteral orifice after resection. During resection, I had an open-ended ureteral catheter and it for protection.   07/24/2021  Patient status post restage TURBT. No evidence of further malignancy. Pathology was negative. She did have yeast in the specimen. She is having some intermittent dysuria.   12/01/2021  Patient did well with induction BCG x6. Presents for surveillance cystoscopy.   05/27/2022: 67 year old female with a past medical history of invasive low-grade papillary urothelial cell carcinoma. She underwent BCG induction x 6 and had a surveillance cystoscopy in January which was negative for recurrence. She presents today with concerns of dysuria, and suprapubic itching. She states the itching is not in her vagina but more in her  bladder. She denies gross blood, fevers, chills. She does not believe she is urinating more frequently than normal.   09/16/2022  Presents for surveillance cystoscopy. Denies interval hematuria or dysuria     ALLERGIES: Penicillin    MEDICATIONS: Atorvastatin Calcium 20 mg tablet  Escitalopram Oxalate 10 mg tablet  Jardiance 25 mg tablet  Levothyroxine Sodium 50 mcg capsule  Lorazepam 0.5 mg tablet  Losartan Potassium 50 mg tablet  Melatonin 10 mg capsule  Metformin Er Gastric 500 mg tablet, er gastric retention 24 hr  Tresiba Flextouch U-100  Trulicity 3 mg/0.5 ml pen injector  Venlafaxine Hcl Er 150 mg tablet, extended release 24 hr     GU PSH: Bladder Instill AntiCA Agent - 10/20/2021, 10/10/2021, 09/26/2021, 08/20/2021, 08/13/2021, 08/06/2021 Cysto Dilate Stricture (M or F) - 06/16/2021 Cysto Remove Stent FB Sim - 06/24/2021 Cystoscopy - 06/11/2022, 03/03/2022, 12/01/2021, 05/30/2021 Cystoscopy Insert Stent - 06/16/2021 Cystoscopy TURBT >5 cm - 06/16/2021 Cystoscopy TURBT 2-5 cm - 07/16/2021 Cystoscopy Ureteroscopy - 06/16/2021 Locm 300-399Mg /Ml Iodine,1Ml - 05/06/2021     NON-GU PSH: Hysterectomy         GU PMH: Bladder Cancer overlapping sites - 06/11/2022, - 03/03/2022, - 12/01/2021, - 10/20/2021, - 10/10/2021, - 09/26/2021, - 09/03/2021, - 08/20/2021, - 08/13/2021, - 08/06/2021, - 07/24/2021, - 06/24/2021 Acute Cystitis/UTI - 05/27/2022, - 07/24/2021 Candidiasis of vulva and vagina - 05/27/2022 Bladder tumor/neoplasm - 06/20/2021, - 05/30/2021 Microscopic hematuria - 05/30/2021, - 05/06/2021, - 04/24/2021    NON-GU PMH: Anxiety GERD Hypercholesterolemia Hypertension    FAMILY HISTORY: 2 sons - Other Death In The Family Father - Other Death In The Family Mother - Other   SOCIAL HISTORY: Marital Status: Married Preferred Language: English; Ethnicity:  Not Hispanic Or Latino; Race: White Current Smoking Status: Patient has never smoked.  <DIV'  Tobacco Use Assessment Completed:  Used Tobacco in last 30  days?   Does not use smokeless tobacco. Does not drink caffeine. Has not had a blood transfusion.    REVIEW OF SYSTEMS:     GU Review Female:  Patient denies frequent urination, hard to postpone urination, burning /pain with urination, get up at night to urinate, leakage of urine, stream starts and stops, trouble starting your stream, have to strain to urinate, and being pregnant.    Gastrointestinal (Upper):  Patient denies nausea, vomiting, and indigestion/ heartburn.    Gastrointestinal (Lower):  Patient denies diarrhea and constipation.    Constitutional:  Patient denies fever, night sweats, weight loss, and fatigue.    Skin:  Patient denies skin rash/ lesion and itching.    Eyes:  Patient denies blurred vision and double vision.    Ears/ Nose/ Throat:  Patient denies sore throat and sinus problems.    Hematologic/Lymphatic:  Patient denies swollen glands and easy bruising.    Cardiovascular:  Patient denies leg swelling and chest pains.    Respiratory:  Patient denies cough and shortness of breath.    Endocrine:  Patient denies excessive thirst.    Musculoskeletal:  Patient denies back pain and joint pain.    Neurological:  Patient denies headaches and dizziness.    Psychologic:  Patient denies depression and anxiety.    VITAL SIGNS: None     MULTI-SYSTEM PHYSICAL EXAMINATION:      Constitutional: Well-nourished. No physical deformities. Normally developed. Good grooming.     Neurologic / Psychiatric: Oriented to time, oriented to place, oriented to person. No depression, no anxiety, no agitation.     Gastrointestinal: No mass, no tenderness, no rigidity, non obese abdomen.     Eyes: Normal conjunctivae. Normal eyelids.     Musculoskeletal: Normal gait and station of head and neck.            Complexity of Data:   Source Of History:  Patient  Records Review:  Previous Doctor Records, Previous Patient Records  Urine Test Review:  Urinalysis   PROCEDURES:    Flexible  Cystoscopy - 52000  Risks, benefits, and the potential complications of the procedure were discussed with the patient including infection, bleeding, voiding discomfort, urinary retention, etc. All questions were answered. Consent was obtained. Sterile technique and intraurethral analgesia were used.  Meatus:  Normal size. Normal location. Normal condition.  Urethra:  Normal urethra  Ureteral Orifices:  Normal location. Normal size. Normal shape. Effluxed clear urine.  Bladder:  No trabeculation. 2 cm or so very superficial appearing slightly raised papillary tumor on the left lateral wall      The procedure was well-tolerated and without complications. Instructions were given to call the office if she developed any problems. The patient stated that she understood these instructions.    Urinalysis w/Scope - 81001  Dipstick Dipstick Cont'd Micro  Color: Yellow Bilirubin: Neg mg/dL WBC/hpf: NS (Not Seen)  Appearance: Clear Ketones: Trace mg/dL RBC/hpf: 3 - 66/AYT  Specific Gravity: 1.015 Blood: 2+ ery/uL Bacteria: NS (Not Seen)  pH: <=5.0 Protein: Neg mg/dL Cystals: Amorph Urates  Glucose: 3+ mg/dL Urobilinogen: 0.2 mg/dL Casts: NS (Not Seen)   Nitrites: Neg Trichomonas: Not Present   Leukocyte Esterase: Neg leu/uL Mucous: Not Present    Epithelial Cells: NS (Not Seen)    Yeast: NS (Not Seen)    Sperm: Not Present  Notes: microscopic performed on unspun specimen due to QNS     ASSESSMENT:     ICD-10 Details  1 GU:  Bladder Cancer overlapping sites - C67.8 Chronic, Stable   PLAN:   Medications  Stop Meds: Fluconazole 150 mg tablet 1 tablet PO Q1WK Start: 05/27/2022  Discontinue: 09/16/2022 - Reason: The medication cycle was completed.    Orders  Labs Urine Culture  Document  Letter(s):  Created for Patient: Clinical Summary   Notes:  Plan for cystoscopy with bilateral retrograde pyelogram, transurethral resection of bladder tumor, instillation of gemcitabine. Risk and benefits  discussed.   CC: Dr. Evlyn Kanner   Signed by Modena Slater, III, M.D. on 09/16/22 at 3:02 PM (EDT

## 2022-09-28 NOTE — Discharge Instructions (Addendum)
Transurethral Resection of Bladder Tumor (TURBT) or Bladder Biopsy   Definition:  Transurethral Resection of the Bladder Tumor is a surgical procedure used to diagnose and remove tumors within the bladder. TURBT is the most common treatment for early stage bladder cancer.  General instructions:     Your recent bladder surgery requires very little post hospital care but some definite precautions.  Despite the fact that no skin incisions were used, the area around the bladder incisions are raw and covered with scabs to promote healing and prevent bleeding. Certain precautions are needed to insure that the scabs are not disturbed over the next 2-4 weeks while the healing proceeds.  Because the raw surface inside your bladder and the irritating effects of urine you may expect frequency of urination and/or urgency (a stronger desire to urinate) and perhaps even getting up at night more often. This will usually resolve or improve slowly over the healing period. You may see some blood in your urine over the first 6 weeks. Do not be alarmed, even if the urine was clear for a while. Get off your feet and drink lots of fluids until clearing occurs. If you start to pass clots or don't improve call us.  Diet:  You may return to your normal diet immediately. Because of the raw surface of your bladder, alcohol, spicy foods, foods high in acid and drinks with caffeine may cause irritation or frequency and should be used in moderation. To keep your urine flowing freely and avoid constipation, drink plenty of fluids during the day (8-10 glasses). Tip: Avoid cranberry juice because it is very acidic.  Activity:  Your physical activity doesn't need to be restricted. However, if you are very active, you may see some blood in the urine. We suggest that you reduce your activity under the circumstances until the bleeding has stopped.  Bowels:  It is important to keep your bowels regular during the postoperative  period. Straining with bowel movements can cause bleeding. A bowel movement every other day is reasonable. Use a mild laxative if needed, such as milk of magnesia 2-3 tablespoons, or 2 Dulcolax tablets. Call if you continue to have problems. If you had been taking narcotics for pain, before, during or after your surgery, you may be constipated. Take a laxative if necessary.    Medication:  You should resume your pre-surgery medications unless told not to. In addition you may be given an antibiotic to prevent or treat infection. Antibiotics are not always necessary. All medication should be taken as prescribed until the bottles are finished unless you are having an unusual reaction to one of the drugs.   Post Anesthesia Home Care Instructions  Activity: Get plenty of rest for the remainder of the day. A responsible individual must stay with you for 24 hours following the procedure.  For the next 24 hours, DO NOT: -Drive a car -Operate machinery -Drink alcoholic beverages -Take any medication unless instructed by your physician -Make any legal decisions or sign important papers.  Meals: Start with liquid foods such as gelatin or soup. Progress to regular foods as tolerated. Avoid greasy, spicy, heavy foods. If nausea and/or vomiting occur, drink only clear liquids until the nausea and/or vomiting subsides. Call your physician if vomiting continues.  Special Instructions/Symptoms: Your throat may feel dry or sore from the anesthesia or the breathing tube placed in your throat during surgery. If this causes discomfort, gargle with warm salt water. The discomfort should disappear within 24 hours.       

## 2022-09-28 NOTE — Transfer of Care (Signed)
Immediate Anesthesia Transfer of Care Note  Patient: Shelley Jenkins  Procedure(s) Performed: TRANSURETHRAL RESECTION OF BLADDER TUMOR (TURBT) with GEMCITABINE (Bladder) CYSTOSCOPY WITH BILATERAL RETROGRADE PYELOGRAM (Bilateral: Ureter)  Patient Location: PACU  Anesthesia Type:General  Level of Consciousness: sedated  Airway & Oxygen Therapy: Patient Spontanous Breathing and Patient connected to face mask oxygen  Post-op Assessment: Report given to RN and Post -op Vital signs reviewed and stable  Post vital signs: Reviewed and stable  Last Vitals:  Vitals Value Taken Time  BP 106/63 09/28/22 0918  Temp    Pulse 87 09/28/22 0921  Resp 12 09/28/22 0921  SpO2 100 % 09/28/22 0921  Vitals shown include unfiled device data.  Last Pain:  Vitals:   09/28/22 0746  TempSrc: Oral  PainSc: 0-No pain      Patients Stated Pain Goal: 7 (09/28/22 0746)  Complications: No notable events documented.

## 2022-09-28 NOTE — Anesthesia Preprocedure Evaluation (Signed)
Anesthesia Evaluation  Patient identified by MRN, date of birth, ID band Patient awake    Reviewed: Allergy & Precautions, NPO status , Patient's Chart, lab work & pertinent test results, reviewed documented beta blocker date and time   History of Anesthesia Complications (+) PONV, DIFFICULT AIRWAY and history of anesthetic complications  Airway Mallampati: IV  TM Distance: <3 FB Neck ROM: Full  Mouth opening: Limited Mouth Opening  Dental no notable dental hx.    Pulmonary    breath sounds clear to auscultation       Cardiovascular hypertension, (-) angina (-) CAD, (-) Past MI, (-) Cardiac Stents, (-) CABG and (-) DOE (-) dysrhythmias (-) pacemaker Rhythm:Regular Rate:Normal     Neuro/Psych  PSYCHIATRIC DISORDERS Anxiety Depression       GI/Hepatic ,GERD  Controlled and Medicated,,  Endo/Other  diabetes, Poorly Controlled, Insulin Dependent, Oral Hypoglycemic AgentsHypothyroidism    Renal/GU    Bladder ca    Musculoskeletal  (+) Arthritis ,    Abdominal   Peds  Hematology   Anesthesia Other Findings   Reproductive/Obstetrics                              Anesthesia Physical Anesthesia Plan  ASA: 2  Anesthesia Plan: General   Post-op Pain Management:    Induction: Intravenous  PONV Risk Score and Plan: 3 and TIVA, Ondansetron and Dexamethasone  Airway Management Planned: LMA and Video Laryngoscope Planned  Additional Equipment:   Intra-op Plan:   Post-operative Plan: Extubation in OR  Informed Consent: I have reviewed the patients History and Physical, chart, labs and discussed the procedure including the risks, benefits and alternatives for the proposed anesthesia with the patient or authorized representative who has indicated his/her understanding and acceptance.     Dental advisory given  Plan Discussed with: CRNA  Anesthesia Plan Comments: (Prior difficult airway,  VL on standby)         Anesthesia Quick Evaluation

## 2022-09-29 ENCOUNTER — Encounter (HOSPITAL_BASED_OUTPATIENT_CLINIC_OR_DEPARTMENT_OTHER): Payer: Self-pay | Admitting: Urology

## 2022-09-30 LAB — SURGICAL PATHOLOGY

## 2022-10-13 ENCOUNTER — Encounter: Payer: Self-pay | Admitting: *Deleted

## 2022-11-05 ENCOUNTER — Ambulatory Visit: Payer: Medicare Other | Admitting: Internal Medicine

## 2022-11-05 ENCOUNTER — Encounter: Payer: Self-pay | Admitting: Internal Medicine

## 2022-11-09 ENCOUNTER — Telehealth: Payer: Self-pay | Admitting: Internal Medicine

## 2022-11-09 NOTE — Telephone Encounter (Signed)
Received questionnaire and will need OV due to constipation.

## 2022-11-11 ENCOUNTER — Encounter: Payer: Self-pay | Admitting: Gastroenterology

## 2022-11-11 ENCOUNTER — Ambulatory Visit (INDEPENDENT_AMBULATORY_CARE_PROVIDER_SITE_OTHER): Payer: Medicare Other | Admitting: Gastroenterology

## 2022-11-11 VITALS — BP 135/85 | HR 97 | Temp 97.7°F | Ht 62.5 in | Wt 141.2 lb

## 2022-11-11 DIAGNOSIS — K5900 Constipation, unspecified: Secondary | ICD-10-CM | POA: Insufficient documentation

## 2022-11-11 DIAGNOSIS — Z860101 Personal history of adenomatous and serrated colon polyps: Secondary | ICD-10-CM | POA: Insufficient documentation

## 2022-11-11 MED ORDER — LUBIPROSTONE 24 MCG PO CAPS: 24.0000 ug | ORAL_CAPSULE | Freq: Two times a day (BID) | ORAL | 11 refills | Status: DC

## 2022-11-11 NOTE — Patient Instructions (Addendum)
Start lubiprostone twice daily with food for constipation. RX sent to your pharmacy.  If your bowels are not moving better after 10 days, then I want you to ADD Miralax two capfuls in 10 ounces of water/tea/coffee daily. Once stools moving well, then you can back down on Miralax to one capful daily as needed.   Try to drink plenty of fluids during the day at least 64 ounces.   Please call my CMA Tammy at [phone] in 2-3 weeks and let me know how your bowels are doing. Once constipation adequately managed, we will move forward with your colonoscopy.

## 2022-11-11 NOTE — Progress Notes (Signed)
GI Office Note    Referring Provider: Adrian Prince, MD Primary Care Physician:  Adrian Prince, MD  Primary Gastroenterologist: Hennie Duos. Marletta Lor, DO   Chief Complaint   Chief Complaint  Patient presents with   Colonoscopy    Issues with constipation    History of Present Illness   Shelley Jenkins is a 67 y.o. female presenting today for constipation. History of poor bowel preps for colonoscopy.    With her initial colonoscopy in 2016 she did well with adequate bowel prep.  According to the colonoscopy report she received Moviprep.  She does not recall which prep she had with her colonoscopy attempt in 2022 but she does remember that she did not have a bowel movement after starting the bowel prep and around 4 AM developed vomiting.  In April prior to colonoscopy, she took Linzess 290 mcg daily for several days and then tried [employer] prep.  Patient states that she was having bowel movements but she felt like she was not clean.  She never got to passing clear stools.  After her last attempted colonoscopy, she stayed on Linzess daily (290 mcg) but never really felt like it was working.  She reports her constipation has been more of an issue over the past couple of years.  She has been on Trulicity or similar for about 3 years.  She does not take pain medication.  Currently alternates over-the-counter laxatives with stool softeners, generally taking laxative about 2-3 times per week.  She has a bowel movement every couple days or so.  Sometimes stools are adequate but other times they are not.  Denies any melena or rectal bleeding.  No abdominal pain.  No upper GI symptoms.  Recently found that her bladder cancer came back, requiring additional bladder washes. Urology closely monitoring.    Patient would like to attempt a colonoscopy again once she is sure that her bowels are moving adequately at baseline.  She notes that she is not able to take large volume preps due to  vomiting.   Colonoscopy 2016 with Dr. Lina Sar: -Sessile polyp in the sigmoid colon removed, tubular adenoma -Prep good with Moviprep  Colonoscopy January 2022 with Dr. Cala Bradford beavers: patient does not remember what prep but was not pills -Hemorrhoids on perianal exam -Skin tags on perianal exam -2 mm polyp, sessile removed from the sigmoid colon, path showed lymphoid aggregate -Moderate amount of stool found in the entire colon interfering with his normalization -Repeat colonoscopy in 12 to 18 months because of poor bowel prep  Colonoscopy April 2024 by Dr. Marletta Lor: SUTAB with four days of Linzess -4 mm polyp in the cecum, sessile removed, tubular adenoma -Localized area of mildly nodular mucosa found in the cecum status post biopsy, benign biopsy -Colon prep inadequate, stool in the entire examined colon   Medications   Current Outpatient Medications  Medication Sig Dispense Refill   atorvastatin (LIPITOR) 20 MG tablet Take 20 mg by mouth daily.     Insulin Degludec (TRESIBA FLEXTOUCH [place]) Inject 48 Units into the skin in the morning.     JARDIANCE 25 MG TABS tablet Take 25 mg by mouth daily.     levothyroxine (SYNTHROID, LEVOTHROID) 50 MCG tablet Take 50 mcg by mouth daily before breakfast.     LORazepam (ATIVAN) 0.5 MG tablet Take 1 mg by mouth at bedtime.     losartan (COZAAR) 100 MG tablet Take 50 mg by mouth daily.     Melatonin 10 MG  TABS Take 10 mg by mouth at bedtime.     metFORMIN (GLUCOPHAGE) 500 MG tablet Take 500 mg by mouth 2 (two) times daily with a meal.     TRULICITY 3 MG/0.5ML SOPN Inject 3 mg into the skin every Sunday.     venlafaxine XR (EFFEXOR-XR) 150 MG 24 hr capsule Take 150 mg by mouth 2 (two) times daily.     No current facility-administered medications for this visit.    Allergies   Allergies as of 11/11/2022 - Review Complete 11/11/2022  Allergen Reaction Noted   Penicillins Hives 11/11/2012    Review of Systems   General: Negative for  anorexia, weight loss, fever, chills, fatigue, weakness. ENT: Negative for hoarseness, difficulty swallowing , nasal congestion. CV: Negative for chest pain, angina, palpitations, dyspnea on exertion, peripheral edema.  Respiratory: Negative for dyspnea at rest, dyspnea on exertion, cough, sputum, wheezing.  GI: See history of present illness. GU:  Negative for dysuria, hematuria, urinary incontinence, urinary frequency, nocturnal urination.  Endo: Negative for unusual weight change.     Physical Exam   BP 135/85 (BP Location: Right Arm, Patient Position: Sitting, Cuff Size: Normal)   Pulse 97   Temp 97.7 F (36.5 C) (Oral)   Ht 5' 2.5" (1.588 m)   Wt 141 lb 3.2 oz (64 kg)   SpO2 97%   BMI 25.41 kg/m    General: Well-nourished, well-developed in no acute distress.  Eyes: No icterus. Mouth: Oropharyngeal mucosa moist and pink  Lungs: Clear to auscultation bilaterally.  Heart: Regular rate and rhythm, no murmurs rubs or gallops.  Abdomen: Bowel sounds are normal, nontender, nondistended, no hepatosplenomegaly or masses,  no abdominal bruits or hernia , no rebound or guarding.  Rectal: Not performed Extremities: No lower extremity edema. No clubbing or deformities. Neuro: Alert and oriented x 4   Skin: Warm and dry, no jaundice.   Psych: Alert and cooperative, normal mood and affect.  Labs   None available  Imaging Studies   No results found.  Assessment/Plan   Constipation -complicating colonoscopy attempts, now with two inadequate bowel preps in two years, failed Sutab, other prep for 2022 attempt unknown. Last complete colonoscopy in 2016.  -failed Linzess -try amitiza bid with food, if results not adequate after 10 days, then ADD miralax 2 capfuls in 10 ounces of water daily until adequate stools, then continue one capful daily as needed. -patient to call with progress report in 2-3 weeks or sooner if needed. Once bowels adequately controlled we will  schedule her for a colonoscopy, with two days of clear liquids, two day bowel prep (no large volume prep due to vomiting)   H/O adenomatous colon polyps -colonoscopy once bowel regimen effective as outlined above  Leanna Battles. Melvyn Neth, MHS, PA-C [facility]

## 2023-07-29 ENCOUNTER — Other Ambulatory Visit: Payer: Self-pay | Admitting: Gastroenterology

## 2023-08-04 ENCOUNTER — Ambulatory Visit (INDEPENDENT_AMBULATORY_CARE_PROVIDER_SITE_OTHER): Admitting: Gastroenterology

## 2023-08-04 ENCOUNTER — Encounter: Payer: Self-pay | Admitting: Gastroenterology

## 2023-08-04 VITALS — BP 138/81 | HR 108 | Temp 97.5°F | Ht 62.5 in | Wt 130.6 lb

## 2023-08-04 DIAGNOSIS — Z860101 Personal history of adenomatous and serrated colon polyps: Secondary | ICD-10-CM | POA: Diagnosis not present

## 2023-08-04 DIAGNOSIS — K5900 Constipation, unspecified: Secondary | ICD-10-CM

## 2023-08-04 NOTE — Progress Notes (Signed)
 GI Office Note    Referring Provider: Nichole Senior, MD Primary Care Physician:  Nichole Senior, MD  Primary Gastroenterologist: Carlin POUR. Cindie, DO   Chief Complaint   Chief Complaint  Patient presents with   Colonoscopy    Here to schedule colonoscopy due to bad prep and constipation     History of Present Illness   Shelley Jenkins is a 68 y.o. female presenting today for follow up. Last seen in 11/2022. She has history of constipation and poor bowel preps for colonoscopy. Work up delayed with history of recurrent bladder cancer and personal losses.   With her initial colonoscopy in 2016 she did well with adequate bowel prep.  According to the colonoscopy report she received Moviprep.  She does not recall which prep she had with her colonoscopy attempt in 2022 but she does remember that she did not have a bowel movement after starting the bowel prep and around 4 AM developed vomiting.  In April prior to colonoscopy, she took Linzess  290 mcg daily for several days and then tried Sutab  prep.  Patient states that she was having bowel movements but she felt like she was not clean.  She never got to passing clear stools. Unable to tolerate large volume preps due to vomiting. Linzess  daily for constipation was not effective for her.   After last ov, we started her on amitiza  24mcg BID. Instructions to add miralax if needed. She was to call with progress report and once daily constipation managed effective, then proceed with colonoscopy.   Today: Could not tolerate 8 ounces of miralax due to nausea. She was able to get down about 4 ounces. She is not sure if its what is in miralax, could in part be due to volume. She feels like overall tolerates fluid in general but has issues drinking 8 ounces at one time, especially water, due to early satiety, nausea. Notes she got really sick after BCG and antibiotic treatment. Developed hallucinations. Lost her appetite. Drinking more than eating for a  period of time but getting better. Drinks 5-6 glasses of water a day minimum. Typically drinks, tea, milk, cranberry juice.   Notes while visiting a friend several months ago, she tried Linzess  with her amitiza  and this seemed to really help. Did it only a couple of times. Now taking only amitiza  (lubiprostone ) twice daily. Overall better than baseline of going 5-6 days without a stool. Now may skip 2-3 days without a BM, then have a small BM. Next day may have good BM, but then skips 2-3 days again. Some stools are hard. No melena, brbpr. No heartburn, dysphagia, abdominal pain.                                                 Wt Readings from Last 3 Encounters:  08/04/23 130 lb 9.6 oz (59.2 kg)  11/11/22 141 lb 3.2 oz (64 kg)  09/28/22 139 lb 9.6 oz (63.3 kg)   Colonoscopy 2016 with Dr. Princella Nida: -Sessile polyp in the sigmoid colon removed, tubular adenoma -Prep good with Moviprep   Colonoscopy January 2022 with Dr. Suzen beavers: patient does not remember what prep but was not pills -Hemorrhoids on perianal exam -Skin tags on perianal exam -2 mm polyp, sessile removed from the sigmoid colon, path showed lymphoid aggregate -Moderate amount of stool found  in the entire colon interfering with his normalization -Repeat colonoscopy in 12 to 18 months because of poor bowel prep   Colonoscopy April 2024 by Dr. Cindie: SUTAB  with four days of Linzess  -4 mm polyp in the cecum, sessile removed, tubular adenoma -Localized area of mildly nodular mucosa found in the cecum status post biopsy, benign biopsy -Colon prep inadequate, stool in the entire examined colon      Medications   Current Outpatient Medications  Medication Sig Dispense Refill   atorvastatin (LIPITOR) 20 MG tablet Take 20 mg by mouth daily.     escitalopram (LEXAPRO) 10 MG tablet Take 10 mg by mouth daily.     JARDIANCE 25 MG TABS tablet Take 25 mg by mouth daily.     levothyroxine (SYNTHROID, LEVOTHROID) 50 MCG tablet  Take 50 mcg by mouth daily before breakfast.     LORazepam (ATIVAN) 0.5 MG tablet Take 1 mg by mouth at bedtime.     losartan (COZAAR) 100 MG tablet Take 50 mg by mouth daily.     lubiprostone  (AMITIZA ) 24 MCG capsule Take 1 capsule (24 mcg total) by mouth 2 (two) times daily with a meal. 60 capsule 11   Melatonin 10 MG TABS Take 10 mg by mouth at bedtime.     metFORMIN (GLUCOPHAGE) 500 MG tablet Take 500 mg by mouth 2 (two) times daily with a meal.     mupirocin ointment (BACTROBAN) 2 % Apply 1 Application topically 3 (three) times daily.     TRESIBA FLEXTOUCH 100 UNIT/ML FlexTouch Pen Inject 48 Units into the skin daily.     TRULICITY 3 MG/0.5ML SOPN Inject 3 mg into the skin every Sunday.     venlafaxine XR (EFFEXOR-XR) 150 MG 24 hr capsule Take 150 mg by mouth 2 (two) times daily.     No current facility-administered medications for this visit.    Allergies   Allergies as of 08/04/2023 - Review Complete 08/04/2023  Allergen Reaction Noted   Penicillins Hives 11/11/2012    Past Medical History   Past Medical History:  Diagnosis Date   Allergy    seasonal allergies   Anxiety    on meds   Arthritis    Blood transfusion without reported diagnosis    hysterectomy   Cancer (HCC)    bladder   Cataract    bilateral sx   Depression    on meds   Diabetes mellitus without complication (HCC)    on meds   Difficult airway for intubation, initial encounter 06/16/2021   DLx4 by student, CRNA, & MDA, then glidescope   GERD (gastroesophageal reflux disease)    Hypercholesteremia    on meds   Hypertension    on meds   Hypothyroidism    on meds   PONV (postoperative nausea and vomiting)     Past Surgical History   Past Surgical History:  Procedure Laterality Date   ABDOMINAL HYSTERECTOMY     APPENDECTOMY     yrs ago done with ovary removal   BIOPSY  05/05/2022   Procedure: BIOPSY;  Surgeon: Cindie Carlin POUR, DO;  [facility];  Service: Endoscopy;;   BREAST  SURGERY Right    bening lump removed   CATARACT EXTRACTION W/PHACO Left 11/28/2012   Procedure: CATARACT EXTRACTION PHACO AND INTRAOCULAR LENS PLACEMENT (IOC);  Surgeon: Cherene Mania, MD;  Location: [facility];  Service: Ophthalmology;  Laterality: Left;  CDE:  11.60   CATARACT EXTRACTION W/PHACO Right 12/08/2012   Procedure: CATARACT EXTRACTION  PHACO AND INTRAOCULAR LENS PLACEMENT (IOC);  Surgeon: Cherene Mania, MD;  Location: [facility];  Service: Ophthalmology;  Laterality: Right;  CDE:17.33   CHOLECYSTECTOMY     COLONOSCOPY  2016   DB-moviprep (good)-TA x 1   COLONOSCOPY WITH PROPOFOL  N/A 05/05/2022   Procedure: COLONOSCOPY WITH PROPOFOL ;  Surgeon: Cindie Carlin POUR, DO;  [facility];  Service: Endoscopy;  Laterality: N/A;  11:00am, asa 2, pt knows to arrive at 6:30   CYSTOSCOPY W/ RETROGRADES Bilateral 09/28/2022   Procedure: CYSTOSCOPY WITH BILATERAL RETROGRADE PYELOGRAM;  Surgeon: Carolee Sherwood JONETTA DOUGLAS, MD;  Location[facility];  Service: Urology;  Laterality: Bilateral;   CYSTOSCOPY W/ URETERAL STENT PLACEMENT Left 06/16/2021   Procedure: CYSTOSCOPY WITH RETROGRADE PYELOGRAM/URETERAL STENT PLACEMENT;  Surgeon: Carolee Sherwood JONETTA DOUGLAS, MD;  Location: [facility];  Service: Urology;  Laterality: Left;   POLYPECTOMY  2016   1 TA   POLYPECTOMY  05/05/2022   Procedure: POLYPECTOMY;  Surgeon: Cindie Carlin POUR, DO;  [facility];  Service: Endoscopy;;   TRANSURETHRAL RESECTION OF BLADDER TUMOR WITH MITOMYCIN -C N/A 06/16/2021   Procedure: TRANSURETHRAL RESECTION OF BLADDER TUMOR;  Surgeon: Carolee Sherwood JONETTA DOUGLAS, MD;  Location: [facility];  Service: Urology;  Laterality: N/A;  1 HR FOR CASE    Past Family History   Family History  Problem Relation Age of Onset   Stomach cancer Maternal Grandmother        ?stomach CA-unknown type   Colon cancer Neg Hx    Colon polyps Neg Hx    Esophageal cancer Neg Hx    Rectal cancer Neg Hx     Past Social History   Social History    Socioeconomic History   Marital status: Married    Spouse name: Not on file   Number of children: Not on file   Years of education: Not on file   Highest education level: Not on file  Occupational History   Not on file  Tobacco Use   Smoking status: Never    Passive exposure: Never   Smokeless tobacco: Never  Vaping Use   Vaping status: Never Used  Substance and Sexual Activity   Alcohol use: No   Drug use: No   Sexual activity: Yes    Birth control/protection: Surgical  Other Topics Concern   Not on file  Social History Narrative   Not on file   Social Drivers of Health   Financial Resource Strain: Not on file  Food Insecurity: Not on file  Transportation Needs: Not on file  Physical Activity: Not on file  Stress: Not on file  Social Connections: Not on file  Intimate Partner Violence: Not At Risk (06/19/2020)   Received from [facility]   Humiliation, Afraid, Rape, and Kick questionnaire    Within the last year, have you been afraid of your partner or ex-partner?: No    Within the last year, have you been humiliated or emotionally abused in other ways by your partner or ex-partner?: No    Within the last year, have you been kicked, hit, slapped, or otherwise physically hurt by your partner or ex-partner?: No    Within the last year, have you been raped or forced to have any kind of sexual activity by your partner or ex-partner?: No    Review of Systems   General: Negative for anorexia, weight loss, fever, chills, fatigue, weakness. Eyes: Negative for vision changes.  ENT: Negative for hoarseness, difficulty swallowing , nasal  congestion. CV: Negative for chest pain, angina, palpitations, dyspnea on exertion, peripheral edema.  Respiratory: Negative for dyspnea at rest, dyspnea on exertion, cough, sputum, wheezing.  GI: See history of present illness. GU:  Negative for dysuria, hematuria, urinary incontinence, urinary frequency, nocturnal urination.  MS:  Negative for joint pain, low back pain.  Derm: Negative for rash or itching.  Neuro: Negative for weakness, abnormal sensation, seizure, frequent headaches, memory loss,  confusion.  Psych: Negative for anxiety, depression, suicidal ideation, hallucinations.  Endo: Negative for unusual weight change.  Heme: Negative for bruising or bleeding. Allergy: Negative for rash or hives.  Physical Exam   BP 138/81 (BP Location: Right Arm, Patient Position: Sitting, Cuff Size: Normal)   Pulse (!) 108   Temp (!) 97.5 F (36.4 C) (Oral)   Ht 5' 2.5 (1.588 m)   Wt 130 lb 9.6 oz (59.2 kg)   SpO2 98%   BMI 23.51 kg/m    General: Well-nourished, well-developed in no acute distress.  Head: Normocephalic, atraumatic.   Eyes: Conjunctiva pink, no icterus. Mouth: Oropharyngeal mucosa moist and pink  Neck: Supple without thyromegaly, masses, or lymphadenopathy.  Lungs: Clear to auscultation bilaterally.  Heart: Regular rate and rhythm, no murmurs rubs or gallops.  Abdomen: Bowel sounds are normal, nontender, nondistended, no hepatosplenomegaly or masses,  no abdominal bruits or hernia, no rebound or guarding.   Rectal: not performed Extremities: No lower extremity edema. No clubbing or deformities.  Neuro: Alert and oriented x 4 , grossly normal neurologically.  Skin: Warm and dry, no rash or jaundice.   Psych: Alert and cooperative, normal mood and affect.  Labs   Lab Results  Component Value Date   NA 142 09/28/2022   CL 104 09/28/2022   K 4.1 09/28/2022   CO2 26 06/13/2021   BUN 21 09/28/2022   CREATININE 0.60 09/28/2022   GFRNONAA >60 06/13/2021   CALCIUM 9.4 06/13/2021   GLUCOSE 118 (H) 09/28/2022     Imaging Studies   No results found.  Assessment/Plan:   Constipation: -improved but not ideally managed. History of muliple failed bowel preps, the last one with Sutab  and Linzess  for five days preceding colonoscopy. She did not tolerate Miralax two capfuls in 8 ounces of  water. Likely miralax and/or volume in setting of Trulicity.  -we discussed switching to Trulance, trying samples. We have contacted drug rep requesting samples asap.  -until then she will continue lubiprostone  24mcg BID. -she will add one capful of miralax to 4 ounces of tea or flavored water and consume every day -she will let me know if bowel function does not improve. She has direct line to [place], CMA as well as main number.   H/O adenomatous colon polyps: -Colonoscopy with Dr. Cindie in 3 to 4 weeks after bowels improved with change in regimen.   -ASA 2. -Hold Jardiance 72 hours, hold Trulicity 7 days. -Day before colonoscopy take a.m. dose of metformin only, take half dose [place] -2 full days clear liquids -Starting 4 days before colonoscopy take bisacodyl 10 mg daily -2 days before colonoscopy take MiraLAX 17 g in 4 ounces of liquid every 2 hours for 3 doses -Drink plenty of fluids starting at least 3 days prior to colonoscopy and continuing throughout the bowel prep -Patient to call if bowel function has not improved.      Sonny RAMAN. Ezzard, MHS, PA-C [facility]

## 2023-08-04 NOTE — Patient Instructions (Addendum)
 Continue lubiprostone  24mcg twice daily with food for constipation.  Add back Miralax one capful in 4 ounces of tea or flavored water. I use Mio drops to flavor my water (they have multiple flavors, with no sugar, you find it in the section of the grocery store where they keep Koolaid, Crystal Light, etc). We will call our rep to get Trulance samples. Once received, we will switch you to Trulance to see if works better than lubiprostone .  We will work towards scheduling a colonoscopy for you. It is very important to let me know if the above regimen does not work, or you cannot tolerate miralax. We will make adjustments to your colonoscopy bowel prep accordingly.  Please call my CMA, Tammy at [phone] with any questions or concerns. If she does not return your call the same day, please try our general number [phone]).

## 2023-08-10 ENCOUNTER — Telehealth: Payer: Self-pay | Admitting: *Deleted

## 2023-08-10 ENCOUNTER — Encounter: Payer: Self-pay | Admitting: *Deleted

## 2023-08-10 ENCOUNTER — Other Ambulatory Visit: Payer: Self-pay | Admitting: *Deleted

## 2023-08-10 MED ORDER — CLENPIQ 10-3.5-12 MG-GM -GM/175ML PO SOLN: 1.0000 | ORAL | 0 refills | Status: DC

## 2023-08-10 NOTE — Telephone Encounter (Signed)
 LMOVM to call back to schedule TCS with Dr. Cindie. See encounter form for full details.

## 2023-08-10 NOTE — Telephone Encounter (Signed)
 Pt has been scheduled for 09/14/23. Instructions mailed and prep sent to pharmacy.

## 2023-09-14 ENCOUNTER — Encounter (HOSPITAL_COMMUNITY): Payer: Self-pay | Admitting: Internal Medicine

## 2023-09-14 ENCOUNTER — Ambulatory Visit (HOSPITAL_COMMUNITY)
Admission: RE | Admit: 2023-09-14 | Discharge: 2023-09-14 | Disposition: A | Discharge status: Home / Self Care | Attending: Internal Medicine | Admitting: Internal Medicine

## 2023-09-14 ENCOUNTER — Ambulatory Visit (HOSPITAL_COMMUNITY): Admitting: Anesthesiology

## 2023-09-14 ENCOUNTER — Other Ambulatory Visit: Payer: Self-pay

## 2023-09-14 ENCOUNTER — Encounter (HOSPITAL_COMMUNITY)
Admission: RE | Disposition: A | Discharge status: Home / Self Care | Payer: Self-pay | Source: Home / Self Care | Attending: Internal Medicine

## 2023-09-14 DIAGNOSIS — E119 Type 2 diabetes mellitus without complications: Secondary | ICD-10-CM | POA: Diagnosis not present

## 2023-09-14 DIAGNOSIS — K648 Other hemorrhoids: Secondary | ICD-10-CM

## 2023-09-14 DIAGNOSIS — E7800 Pure hypercholesterolemia, unspecified: Secondary | ICD-10-CM | POA: Insufficient documentation

## 2023-09-14 DIAGNOSIS — Z794 Long term (current) use of insulin: Secondary | ICD-10-CM | POA: Diagnosis not present

## 2023-09-14 DIAGNOSIS — Z7984 Long term (current) use of oral hypoglycemic drugs: Secondary | ICD-10-CM | POA: Insufficient documentation

## 2023-09-14 DIAGNOSIS — Z1211 Encounter for screening for malignant neoplasm of colon: Secondary | ICD-10-CM | POA: Diagnosis present

## 2023-09-14 DIAGNOSIS — Z79890 Hormone replacement therapy (postmenopausal): Secondary | ICD-10-CM | POA: Diagnosis not present

## 2023-09-14 DIAGNOSIS — F32A Depression, unspecified: Secondary | ICD-10-CM | POA: Insufficient documentation

## 2023-09-14 DIAGNOSIS — Z7985 Long-term (current) use of injectable non-insulin antidiabetic drugs: Secondary | ICD-10-CM | POA: Insufficient documentation

## 2023-09-14 DIAGNOSIS — K219 Gastro-esophageal reflux disease without esophagitis: Secondary | ICD-10-CM | POA: Insufficient documentation

## 2023-09-14 DIAGNOSIS — I10 Essential (primary) hypertension: Secondary | ICD-10-CM | POA: Insufficient documentation

## 2023-09-14 DIAGNOSIS — Z79899 Other long term (current) drug therapy: Secondary | ICD-10-CM | POA: Diagnosis not present

## 2023-09-14 DIAGNOSIS — Z860101 Personal history of adenomatous and serrated colon polyps: Secondary | ICD-10-CM | POA: Diagnosis not present

## 2023-09-14 DIAGNOSIS — E039 Hypothyroidism, unspecified: Secondary | ICD-10-CM | POA: Diagnosis not present

## 2023-09-14 DIAGNOSIS — F419 Anxiety disorder, unspecified: Secondary | ICD-10-CM | POA: Diagnosis not present

## 2023-09-14 HISTORY — PX: COLONOSCOPY: SHX5424

## 2023-09-14 LAB — GLUCOSE, CAPILLARY: Glucose-Capillary: 90 mg/dL (ref 70–99)

## 2023-09-14 SURGERY — COLONOSCOPY
Anesthesia: General

## 2023-09-14 MED ORDER — LACTATED RINGERS IV SOLN: INTRAVENOUS | Status: DC

## 2023-09-14 MED ORDER — PROPOFOL 500 MG/50ML IV EMUL
INTRAVENOUS | Status: DC | PRN
  Administered 2023-09-14: 100 ug/kg/min via INTRAVENOUS
  Administered 2023-09-14: 120 mg via INTRAVENOUS
  Administered 2023-09-14: 100 ug/kg/min via INTRAVENOUS
  Administered 2023-09-14: 120 mg via INTRAVENOUS

## 2023-09-14 NOTE — Transfer of Care (Signed)
 Immediate Anesthesia Transfer of Care Note  Patient: Shelley Jenkins  Procedure(s) Performed: COLONOSCOPY  Patient Location: Endoscopy Unit  Anesthesia Type:General  Level of Consciousness: awake and patient cooperative  Airway & Oxygen Therapy: Patient Spontanous Breathing  Post-op Assessment: Report given to RN and Post -op Vital signs reviewed and stable  Post vital signs: Reviewed and stable  Last Vitals:  Vitals Value Taken Time  BP 137/65 09/14/23 09:21  Temp 36.4 C 09/14/23 09:21  Pulse 89 09/14/23 09:21  Resp 20 09/14/23 09:21  SpO2 98 % 09/14/23 09:21    Last Pain:  Vitals:   09/14/23 0921  TempSrc: Oral  PainSc: 0-No pain      Patients Stated Pain Goal: 6 (09/14/23 0754)  Complications: No notable events documented.

## 2023-09-14 NOTE — H&P (Signed)
 Primary Care Physician:  Nichole Senior, MD Primary Gastroenterologist:  Dr. Cindie  Pre-Procedure History & Physical: HPI:  Shelley Jenkins is a 68 y.o. female is here for a colonoscopy to be performed for surveillance purposes, personal history of adenomatous colon polyps   Past Medical History:  Diagnosis Date   Allergy    seasonal allergies   Anxiety    on meds   Arthritis    Blood transfusion without reported diagnosis    hysterectomy   Cancer [facility])    bladder   Cataract    bilateral sx   Depression    on meds   Diabetes mellitus without complication (HCC)    on meds   Difficult airway for intubation, initial encounter 06/16/2021   DLx4 by student, CRNA, & MDA, then glidescope   GERD (gastroesophageal reflux disease)    Hypercholesteremia    on meds   Hypertension    on meds   Hypothyroidism    on meds   PONV (postoperative nausea and vomiting)     Past Surgical History:  Procedure Laterality Date   ABDOMINAL HYSTERECTOMY     APPENDECTOMY     yrs ago done with ovary removal   BIOPSY  05/05/2022   Procedure: BIOPSY;  Surgeon: Cindie Carlin POUR, DO;  [facility];  Service: Endoscopy;;   BREAST SURGERY Right    bening lump removed   CATARACT EXTRACTION W/PHACO Left 11/28/2012   Procedure: CATARACT EXTRACTION PHACO AND INTRAOCULAR LENS PLACEMENT (IOC);  Surgeon: Cherene Mania, MD;  Location: [facility];  Service: Ophthalmology;  Laterality: Left;  CDE:  11.60   CATARACT EXTRACTION W/PHACO Right 12/08/2012   Procedure: CATARACT EXTRACTION PHACO AND INTRAOCULAR LENS PLACEMENT (IOC);  Surgeon: Cherene Mania, MD;  Location: [facility];  Service: Ophthalmology;  Laterality: Right;  CDE:17.33   CHOLECYSTECTOMY     COLONOSCOPY  2016   DB-moviprep (good)-TA x 1   COLONOSCOPY WITH PROPOFOL  N/A 05/05/2022   Procedure: COLONOSCOPY WITH PROPOFOL ;  Surgeon: Cindie Carlin POUR, DO;  [facility];  Service: Endoscopy;  Laterality: N/A;  11:00am, asa 2, pt knows to arrive at  6:30   CYSTOSCOPY W/ RETROGRADES Bilateral 09/28/2022   Procedure: CYSTOSCOPY WITH BILATERAL RETROGRADE PYELOGRAM;  Surgeon: Carolee Sherwood JONETTA DOUGLAS, MD;  Location: [facility];  Service: Urology;  Laterality: Bilateral;   CYSTOSCOPY W/ URETERAL STENT PLACEMENT Left 06/16/2021   Procedure: CYSTOSCOPY WITH RETROGRADE PYELOGRAM/URETERAL STENT PLACEMENT;  Surgeon: Carolee Sherwood JONETTA DOUGLAS, MD;  Location: [facility];  Service: Urology;  Laterality: Left;   POLYPECTOMY  2016   1 TA   POLYPECTOMY  05/05/2022   Procedure: POLYPECTOMY;  Surgeon: Cindie Carlin POUR, DO;  [facility];  Service: Endoscopy;;   TRANSURETHRAL RESECTION OF BLADDER TUMOR WITH MITOMYCIN -C N/A 06/16/2021   Procedure: TRANSURETHRAL RESECTION OF BLADDER TUMOR;  Surgeon: Carolee Sherwood JONETTA DOUGLAS, MD;  Location: [facility];  Service: Urology;  Laterality: N/A;  1 HR FOR CASE    Prior to Admission medications   Medication Sig Start Date End Date Taking? Authorizing Provider  atorvastatin (LIPITOR) 20 MG tablet Take 20 mg by mouth daily.   Yes [provider]  escitalopram (LEXAPRO) 10 MG tablet Take 10 mg by mouth daily. 07/21/23  Yes [provider]  levothyroxine (SYNTHROID, LEVOTHROID) 50 MCG tablet Take 50 mcg by mouth daily before breakfast.   Yes [provider]  LORazepam (ATIVAN) 0.5 MG tablet Take 1 mg by mouth at bedtime.  Yes [provider]  losartan (COZAAR) 100 MG tablet Take 50 mg by mouth daily.   Yes [provider]  lubiprostone  (AMITIZA ) 24 MCG capsule TAKE 1 CAPSULE BY MOUTH 2 TIMES DAILY WITH A MEAL 09/03/23  Yes Zetta Stoneman K, DO  Melatonin 10 MG TABS Take 10 mg by mouth at bedtime.   Yes [provider]  metFORMIN (GLUCOPHAGE) 500 MG tablet Take 500 mg by mouth 2 (two) times daily with a meal.   Yes [provider]  mupirocin ointment (BACTROBAN) 2 % Apply 1 Application topically 3 (three) times daily. 07/29/23  Yes [provider]  Sod  Picosulfate-Mag Ox-Cit Acd (CLENPIQ ) 10-3.5-12 MG-GM -GM/175ML SOLN Take 1 kit by mouth as directed. 08/10/23   Cindie Carlin POUR, DO  venlafaxine XR (EFFEXOR-XR) 150 MG 24 hr capsule Take 150 mg by mouth 2 (two) times daily.   Yes [provider]  JARDIANCE 25 MG TABS tablet Take 25 mg by mouth daily. 02/12/20   [provider]  TRESIBA FLEXTOUCH 100 UNIT/ML FlexTouch Pen Inject 48 Units into the skin daily. 06/24/23   [provider]  TRULICITY 3 MG/0.5ML SOPN Inject 3 mg into the skin every Sunday. 11/08/19   [provider]    Allergies as of 08/10/2023 - Review Complete 08/04/2023  Allergen Reaction Noted   Penicillins Hives 11/11/2012    Family History  Problem Relation Age of Onset   Stomach cancer Maternal Grandmother        ?stomach CA-unknown type   Colon cancer Neg Hx    Colon polyps Neg Hx    Esophageal cancer Neg Hx    Rectal cancer Neg Hx     Social History   Socioeconomic History   Marital status: Married    Spouse name: Not on file   Number of children: Not on file   Years of education: Not on file   Highest education level: Not on file  Occupational History   Not on file  Tobacco Use   Smoking status: Never    Passive exposure: Never   Smokeless tobacco: Never  Vaping Use   Vaping status: Never Used  Substance and Sexual Activity   Alcohol use: No   Drug use: No   Sexual activity: Yes    Birth control/protection: Surgical  Other Topics Concern   Not on file  Social History Narrative   Not on file   Social Drivers of Health   Financial Resource Strain: Not on file  Food Insecurity: Not on file  Transportation Needs: Not on file  Physical Activity: Not on file  Stress: Not on file  Social Connections: Not on file  Intimate Partner Violence: Not At Risk (06/19/2020)   Received from [facility]   Humiliation, Afraid, Rape, and Kick questionnaire    Within the last year, have you been afraid of your partner or  ex-partner?: No    Within the last year, have you been humiliated or emotionally abused in other ways by your partner or ex-partner?: No    Within the last year, have you been kicked, hit, slapped, or otherwise physically hurt by your partner or ex-partner?: No    Within the last year, have you been raped or forced to have any kind of sexual activity by your partner or ex-partner?: No    Review of Systems: See HPI, otherwise negative ROS  Physical Exam: Vital signs in last 24 hours: Temp:  [98.6 F (37 C)] 98.6 F (37  C) (08/12 0754) Pulse Rate:  [87] 87 (08/12 0754) Resp:  [15] 15 (08/12 0754) BP: (148)/(87) 148/87 (08/12 0754) SpO2:  [95 %] 95 % (08/12 0754) Weight:  [58.5 kg] 58.5 kg (08/12 0754)   General:   Alert,  Well-developed, well-nourished, pleasant and cooperative in NAD Head:  Normocephalic and atraumatic. Eyes:  Sclera clear, no icterus.   Conjunctiva pink. Ears:  Normal auditory acuity. Nose:  No deformity, discharge,  or lesions. Msk:  Symmetrical without gross deformities. Normal posture. Extremities:  Without clubbing or edema. Neurologic:  Alert and  oriented x4;  grossly normal neurologically. Skin:  Intact without significant lesions or rashes. Psych:  Alert and cooperative. Normal mood and affect.  Impression/Plan: Shelley Jenkins is here for a colonoscopy to be performed for surveillance purposes, personal history of adenomatous colon polyps   The risks of the procedure including infection, bleed, or perforation as well as benefits, limitations, alternatives and imponderables have been reviewed with the patient. Questions have been answered. All parties agreeable.

## 2023-09-14 NOTE — Anesthesia Procedure Notes (Signed)
 Date/Time: 09/14/2023 8:34 AM  Performed by: Barbarann Verneita RAMAN, CRNAPre-anesthesia Checklist: Patient identified, Emergency Drugs available, Suction available, Timeout performed and Patient being monitored Patient Re-evaluated:Patient Re-evaluated prior to induction Oxygen Delivery Method: Nasal Cannula

## 2023-09-14 NOTE — Discharge Instructions (Addendum)
  Colonoscopy Discharge Instructions  Read the instructions outlined below and refer to this sheet in the next few weeks. These discharge instructions provide you with general information on caring for yourself after you leave the hospital. Your doctor may also give you specific instructions. While your treatment has been planned according to the most current medical practices available, unavoidable complications occasionally occur.   ACTIVITY You may resume your regular activity, but move at a slower pace for the next 24 hours.  Take frequent rest periods for the next 24 hours.  Walking will help get rid of the air and reduce the bloated feeling in your belly (abdomen).  No driving for 24 hours (because of the medicine (anesthesia) used during the test).   Do not sign any important legal documents or operate any machinery for 24 hours (because of the anesthesia used during the test).  NUTRITION Drink plenty of fluids.  You may resume your normal diet as instructed by your doctor.  Begin with a light meal and progress to your normal diet. Heavy or fried foods are harder to digest and may make you feel sick to your stomach (nauseated).  Avoid alcoholic beverages for 24 hours or as instructed.  MEDICATIONS You may resume your normal medications unless your doctor tells you otherwise.  WHAT YOU CAN EXPECT TODAY Some feelings of bloating in the abdomen.  Passage of more gas than usual.  Spotting of blood in your stool or on the toilet paper.  IF YOU HAD POLYPS REMOVED DURING THE COLONOSCOPY: No aspirin products for 7 days or as instructed.  No alcohol for 7 days or as instructed.  Eat a soft diet for the next 24 hours.  FINDING OUT THE RESULTS OF YOUR TEST Not all test results are available during your visit. If your test results are not back during the visit, make an appointment with your caregiver to find out the results. Do not assume everything is normal if you have not heard from your  caregiver or the medical facility. It is important for you to follow up on all of your test results.  SEEK IMMEDIATE MEDICAL ATTENTION IF: You have more than a spotting of blood in your stool.  Your belly is swollen (abdominal distention).  You are nauseated or vomiting.  You have a temperature over 101.  You have abdominal pain or discomfort that is severe or gets worse throughout the day.   Your colonoscopy was relatively unremarkable.  I did not find any polyps or evidence of colon cancer.  I recommend repeating colonoscopy in 5 years for surveillance purposes.     Follow-up with GI in 2-3 months   I hope you have a great rest of your week!  Carlin POUR. Cindie, D.O. Gastroenterology and Hepatology [facility]

## 2023-09-14 NOTE — Anesthesia Postprocedure Evaluation (Signed)
 Anesthesia Post Note  Patient: Shelley Jenkins  Procedure(s) Performed: COLONOSCOPY  Patient location during evaluation: Endoscopy Anesthesia Type: General Level of consciousness: awake and alert Pain management: pain level controlled Vital Signs Assessment: post-procedure vital signs reviewed and stable Respiratory status: spontaneous breathing, nonlabored ventilation and respiratory function stable Cardiovascular status: blood pressure returned to baseline and stable Postop Assessment: no apparent nausea or vomiting Anesthetic complications: no   There were no known notable events for this encounter.   Last Vitals:  Vitals:   09/14/23 0754 09/14/23 0921  BP: (!) 148/87 137/65  Pulse: 87 89  Resp: 15 20  Temp: 37 C 36.4 C  SpO2: 95% 98%    Last Pain:  Vitals:   09/14/23 0921  TempSrc: Oral  PainSc: 0-No pain                 Korbin Notaro L Naasia Weilbacher

## 2023-09-14 NOTE — Anesthesia Preprocedure Evaluation (Signed)
 Anesthesia Evaluation  Patient identified by MRN, date of birth, ID band Patient awake    Reviewed: Allergy & Precautions, H&P , NPO status , Patient's Chart, lab work & pertinent test results, reviewed documented beta blocker date and time   History of Anesthesia Complications (+) PONV, DIFFICULT AIRWAY and history of anesthetic complications (Easy intubation with glidescope)  Airway Mallampati: II  TM Distance: >3 FB Neck ROM: full    Dental no notable dental hx. (+) Dental Advisory Given, Teeth Intact   Pulmonary neg pulmonary ROS   Pulmonary exam normal breath sounds clear to auscultation       Cardiovascular Exercise Tolerance: Good hypertension, Normal cardiovascular exam Rhythm:regular Rate:Normal     Neuro/Psych  PSYCHIATRIC DISORDERS Anxiety Depression    negative neurological ROS     GI/Hepatic Neg liver ROS,GERD  ,,  Endo/Other  diabetes, Type 2Hypothyroidism    Renal/GU negative Renal ROS  negative genitourinary   Musculoskeletal  (+) Arthritis , Osteoarthritis,    Abdominal   Peds  Hematology negative hematology ROS (+)   Anesthesia Other Findings cancer  Reproductive/Obstetrics negative OB ROS                              Anesthesia Physical Anesthesia Plan  ASA: 3  Anesthesia Plan: General   Post-op Pain Management: Minimal or no pain anticipated   Induction: Intravenous  PONV Risk Score and Plan: Propofol  infusion  Airway Management Planned: Natural Airway and Nasal Cannula  Additional Equipment:   Intra-op Plan:   Post-operative Plan:   Informed Consent: I have reviewed the patients History and Physical, chart, labs and discussed the procedure including the risks, benefits and alternatives for the proposed anesthesia with the patient or authorized representative who has indicated his/her understanding and acceptance.     Dental Advisory Given  Plan  Discussed with: CRNA  Anesthesia Plan Comments:         Anesthesia Quick Evaluation

## 2023-09-14 NOTE — Op Note (Signed)
 [facility] Patient Name: Shelley Jenkins Procedure Date: 09/14/2023 8:22 AM MRN: [id number] Date of Birth: [birth year] Attending MD: Carlin POUR. Cindie , [place], [id number] CSN: [id number] Age: 68 Admit Type: Outpatient Procedure:                Colonoscopy Indications:              Surveillance: Personal history of colonic polyps                            with unknown histology on last colonoscopy                            (incomplete examination) less than 3 years ago Providers:                Carlin POUR. Cindie, DO, Crystal Page, Jon Loge Referring MD:              Medicines:                See the Anesthesia note for documentation of the                            administered medications Complications:            No immediate complications. Estimated Blood Loss:     Estimated blood loss: none. Procedure:                Pre-Anesthesia Assessment:                           - The anesthesia plan was to use monitored                            anesthesia care (MAC).                           After obtaining informed consent, the colonoscope                            was passed under direct vision. Throughout the                            procedure, the patient's blood pressure, pulse, and                            oxygen saturations were monitored continuously. The                            [id number]-HQ190L ([id number]) scope was introduced through                            the anus and advanced to the the cecum, identified                            by appendiceal orifice and ileocecal valve. The  colonoscopy was somewhat difficult due to poor                            endoscopic visualization. The patient tolerated the                            procedure well. The quality of the bowel                            preparation was evaluated using the BBPS [facility]                            Bowel Preparation Scale) with scores of:  Right                            Colon = 2 (minor amount of residual staining, small                            fragments of stool and/or opaque liquid, but mucosa                            seen well), Transverse Colon = 2 (minor amount of                            residual staining, small fragments of stool and/or                            opaque liquid, but mucosa seen well) and Left Colon                            = 2 (minor amount of residual staining, small                            fragments of stool and/or opaque liquid, but mucosa                            seen well). The total BBPS score equals 6. The                            quality of the bowel preparation was good. Scope In: 8:40:42 AM Scope Out: 9:17:16 AM Scope Withdrawal Time: 0 hours 25 minutes 10 seconds  Total Procedure Duration: 0 hours 36 minutes 34 seconds  Findings:      Non-bleeding internal hemorrhoids were found.      A large amount of semi-liquid stool was found in the entire colon,       making visualization difficult. Lavage of the area was performed using       copious amounts of sterile water, resulting in clearance with good       visualization.      The exam was otherwise without abnormality. Impression:               - Non-bleeding internal hemorrhoids.                           -  Stool in the entire examined colon.                           - The examination was otherwise normal.                           - No specimens collected. Moderate Sedation:      Per Anesthesia Care Recommendation:           - Patient has a contact number available for                            emergencies. The signs and symptoms of potential                            delayed complications were discussed with the                            patient. Return to normal activities tomorrow.                            Written discharge instructions were provided to the                            patient.                            - Resume previous diet.                           - Continue present medications.                           - Repeat colonoscopy in 5 years for surveillance.                           - Return to GI clinic in 3 months. Procedure Code(s):        --- Professional ---                           H9894, Colorectal cancer screening; colonoscopy on                            individual at high risk Diagnosis Code(s):        --- Professional ---                           Z86.010, Personal history of colonic polyps                           K64.8, Other hemorrhoids CPT copyright 2022 American [employer] Association. All rights reserved. The codes documented in this report are preliminary and upon coder review may  be revised to meet current compliance requirements. Carlin POUR. Cindie, DO Carlin POUR. Adante Courington, DO 09/14/2023 9:21:00 AM This report has been signed electronically. Number of Addenda: 0

## 2023-09-15 ENCOUNTER — Encounter (HOSPITAL_COMMUNITY): Payer: Self-pay | Admitting: Internal Medicine

## 2023-09-27 ENCOUNTER — Other Ambulatory Visit: Payer: Self-pay | Admitting: Gastroenterology

## 2023-09-27 ENCOUNTER — Other Ambulatory Visit: Payer: Self-pay | Admitting: Internal Medicine

## 2023-11-04 ENCOUNTER — Encounter: Payer: Self-pay | Admitting: Internal Medicine

## 2023-12-14 ENCOUNTER — Other Ambulatory Visit: Payer: Self-pay

## 2023-12-14 ENCOUNTER — Emergency Department (HOSPITAL_COMMUNITY)

## 2023-12-14 ENCOUNTER — Inpatient Hospital Stay (HOSPITAL_COMMUNITY)
Admission: EM | Admit: 2023-12-14 | Discharge: 2023-12-16 | DRG: 690 | Disposition: A | Discharge status: Home Health | Attending: Family Medicine | Admitting: Family Medicine

## 2023-12-14 ENCOUNTER — Encounter (HOSPITAL_COMMUNITY): Payer: Self-pay | Admitting: *Deleted

## 2023-12-14 DIAGNOSIS — D3502 Benign neoplasm of left adrenal gland: Secondary | ICD-10-CM | POA: Diagnosis present

## 2023-12-14 DIAGNOSIS — Z9842 Cataract extraction status, left eye: Secondary | ICD-10-CM

## 2023-12-14 DIAGNOSIS — Y92009 Unspecified place in unspecified non-institutional (private) residence as the place of occurrence of the external cause: Secondary | ICD-10-CM

## 2023-12-14 DIAGNOSIS — N3592 Unspecified urethral stricture, female: Secondary | ICD-10-CM | POA: Diagnosis present

## 2023-12-14 DIAGNOSIS — Z8551 Personal history of malignant neoplasm of bladder: Secondary | ICD-10-CM

## 2023-12-14 DIAGNOSIS — M25551 Pain in right hip: Secondary | ICD-10-CM | POA: Diagnosis present

## 2023-12-14 DIAGNOSIS — E039 Hypothyroidism, unspecified: Secondary | ICD-10-CM | POA: Diagnosis present

## 2023-12-14 DIAGNOSIS — E7800 Pure hypercholesterolemia, unspecified: Secondary | ICD-10-CM | POA: Diagnosis present

## 2023-12-14 DIAGNOSIS — W19XXXA Unspecified fall, initial encounter: Secondary | ICD-10-CM | POA: Diagnosis present

## 2023-12-14 DIAGNOSIS — Z9049 Acquired absence of other specified parts of digestive tract: Secondary | ICD-10-CM

## 2023-12-14 DIAGNOSIS — Z9841 Cataract extraction status, right eye: Secondary | ICD-10-CM

## 2023-12-14 DIAGNOSIS — W010XXA Fall on same level from slipping, tripping and stumbling without subsequent striking against object, initial encounter: Secondary | ICD-10-CM | POA: Diagnosis present

## 2023-12-14 DIAGNOSIS — R531 Weakness: Secondary | ICD-10-CM | POA: Diagnosis present

## 2023-12-14 DIAGNOSIS — Z79890 Hormone replacement therapy (postmenopausal): Secondary | ICD-10-CM

## 2023-12-14 DIAGNOSIS — N1330 Unspecified hydronephrosis: Secondary | ICD-10-CM

## 2023-12-14 DIAGNOSIS — E1169 Type 2 diabetes mellitus with other specified complication: Secondary | ICD-10-CM | POA: Diagnosis present

## 2023-12-14 DIAGNOSIS — N200 Calculus of kidney: Secondary | ICD-10-CM | POA: Diagnosis present

## 2023-12-14 DIAGNOSIS — I10 Essential (primary) hypertension: Secondary | ICD-10-CM | POA: Diagnosis present

## 2023-12-14 DIAGNOSIS — Z880 Allergy status to penicillin: Secondary | ICD-10-CM

## 2023-12-14 DIAGNOSIS — E7849 Other hyperlipidemia: Secondary | ICD-10-CM | POA: Diagnosis present

## 2023-12-14 DIAGNOSIS — F32A Depression, unspecified: Secondary | ICD-10-CM | POA: Diagnosis present

## 2023-12-14 DIAGNOSIS — Z888 Allergy status to other drugs, medicaments and biological substances status: Secondary | ICD-10-CM

## 2023-12-14 DIAGNOSIS — R262 Difficulty in walking, not elsewhere classified: Secondary | ICD-10-CM

## 2023-12-14 DIAGNOSIS — Z7984 Long term (current) use of oral hypoglycemic drugs: Secondary | ICD-10-CM

## 2023-12-14 DIAGNOSIS — M160 Bilateral primary osteoarthritis of hip: Secondary | ICD-10-CM | POA: Diagnosis present

## 2023-12-14 DIAGNOSIS — I700 Atherosclerosis of aorta: Secondary | ICD-10-CM | POA: Diagnosis present

## 2023-12-14 DIAGNOSIS — F419 Anxiety disorder, unspecified: Secondary | ICD-10-CM | POA: Diagnosis present

## 2023-12-14 DIAGNOSIS — Z800 Family history of malignant neoplasm of digestive organs: Secondary | ICD-10-CM

## 2023-12-14 DIAGNOSIS — Z90710 Acquired absence of both cervix and uterus: Secondary | ICD-10-CM

## 2023-12-14 DIAGNOSIS — K760 Fatty (change of) liver, not elsewhere classified: Secondary | ICD-10-CM | POA: Diagnosis present

## 2023-12-14 DIAGNOSIS — N136 Pyonephrosis: Principal | ICD-10-CM | POA: Diagnosis present

## 2023-12-14 DIAGNOSIS — Z961 Presence of intraocular lens: Secondary | ICD-10-CM | POA: Diagnosis present

## 2023-12-14 DIAGNOSIS — N3000 Acute cystitis without hematuria: Principal | ICD-10-CM

## 2023-12-14 DIAGNOSIS — Z7985 Long-term (current) use of injectable non-insulin antidiabetic drugs: Secondary | ICD-10-CM

## 2023-12-14 DIAGNOSIS — Z79899 Other long term (current) drug therapy: Secondary | ICD-10-CM

## 2023-12-14 LAB — CBC WITH DIFFERENTIAL/PLATELET
Abs Immature Granulocytes: 0.06 K/uL (ref 0.00–0.07)
Basophils Absolute: 0 K/uL (ref 0.0–0.1)
Basophils Relative: 0 %
Eosinophils Absolute: 0 K/uL (ref 0.0–0.5)
Eosinophils Relative: 0 %
HCT: 43.9 % (ref 36.0–46.0)
Hemoglobin: 14.5 g/dL (ref 12.0–15.0)
Immature Granulocytes: 1 %
Lymphocytes Relative: 17 %
Lymphs Abs: 2 K/uL (ref 0.7–4.0)
MCH: 31.7 pg (ref 26.0–34.0)
MCHC: 33 g/dL (ref 30.0–36.0)
MCV: 95.9 fL (ref 80.0–100.0)
Monocytes Absolute: 0.7 K/uL (ref 0.1–1.0)
Monocytes Relative: 6 %
Neutro Abs: 9.1 K/uL — ABNORMAL HIGH (ref 1.7–7.7)
Neutrophils Relative %: 76 %
Platelets: 269 K/uL (ref 150–400)
RBC: 4.58 MIL/uL (ref 3.87–5.11)
RDW: 14.1 % (ref 11.5–15.5)
WBC: 11.9 K/uL — ABNORMAL HIGH (ref 4.0–10.5)
nRBC: 0 % (ref 0.0–0.2)

## 2023-12-14 LAB — COMPREHENSIVE METABOLIC PANEL WITH GFR
ALT: 22 U/L (ref 0–44)
AST: 21 U/L (ref 15–41)
Albumin: 4.2 g/dL (ref 3.5–5.0)
Alkaline Phosphatase: 103 U/L (ref 38–126)
Anion gap: 17 — ABNORMAL HIGH (ref 5–15)
BUN: 15 mg/dL (ref 8–23)
CO2: 25 mmol/L (ref 22–32)
Calcium: 10.5 mg/dL — ABNORMAL HIGH (ref 8.9–10.3)
Chloride: 101 mmol/L (ref 98–111)
Creatinine, Ser: 0.81 mg/dL (ref 0.44–1.00)
GFR, Estimated: >60 mL/min (ref >60)
Glucose, Bld: 131 mg/dL — ABNORMAL HIGH (ref 70–99)
Potassium: 4.3 mmol/L (ref 3.5–5.1)
Sodium: 143 mmol/L (ref 135–145)
Total Bilirubin: 0.6 mg/dL (ref 0.0–1.2)
Total Protein: 8 g/dL (ref 6.5–8.1)

## 2023-12-14 LAB — URINALYSIS, ROUTINE W REFLEX MICROSCOPIC
Bilirubin Urine: NEGATIVE
Glucose, UA: >=500 mg/dL — AB
Hgb urine dipstick: NEGATIVE
Ketones, ur: NEGATIVE mg/dL
Nitrite: POSITIVE — AB
Protein, ur: NEGATIVE mg/dL
Specific Gravity, Urine: 1.022 (ref 1.005–1.030)
WBC, UA: >50 WBC/hpf (ref 0–5)
pH: 5 (ref 5.0–8.0)

## 2023-12-14 LAB — GLUCOSE, CAPILLARY: Glucose-Capillary: 87 mg/dL (ref 70–99)

## 2023-12-14 LAB — CBG MONITORING, ED: Glucose-Capillary: 105 mg/dL — ABNORMAL HIGH (ref 70–99)

## 2023-12-14 MED ORDER — POLYETHYLENE GLYCOL 3350 17 G PO PACK: 17.0000 g | PACK | Freq: Every day | ORAL | Status: DC | PRN

## 2023-12-14 MED ORDER — HYDROCODONE-ACETAMINOPHEN 5-325 MG PO TABS
1.0000 | ORAL_TABLET | Freq: Once | ORAL | Status: AC
Start: 2023-12-14 — End: 2023-12-14
  Administered 2023-12-14: 1 via ORAL
  Filled 2023-12-14: qty 1

## 2023-12-14 MED ORDER — LACTATED RINGERS IV SOLN: INTRAVENOUS | Status: AC

## 2023-12-14 MED ORDER — KETOROLAC TROMETHAMINE 15 MG/ML IJ SOLN
15.0000 mg | Freq: Once | INTRAMUSCULAR | Status: AC
  Administered 2023-12-14: 15 mg via INTRAVENOUS
  Filled 2023-12-14: qty 1

## 2023-12-14 MED ORDER — ENOXAPARIN SODIUM 40 MG/0.4ML IJ SOSY
40.0000 mg | PREFILLED_SYRINGE | INTRAMUSCULAR | Status: DC
  Administered 2023-12-14 – 2023-12-15 (×2): 40 mg via SUBCUTANEOUS
  Filled 2023-12-14 (×2): qty 0.4

## 2023-12-14 MED ORDER — MELATONIN 3 MG PO TABS: 6.0000 mg | ORAL_TABLET | Freq: Every evening | ORAL | Status: DC | PRN

## 2023-12-14 MED ORDER — INSULIN ASPART 100 UNIT/ML IJ SOLN
0.0000 [IU] | Freq: Three times a day (TID) | INTRAMUSCULAR | Status: DC
  Administered 2023-12-15: 5 [IU] via SUBCUTANEOUS
  Administered 2023-12-16: 1 [IU] via SUBCUTANEOUS
  Filled 2023-12-14 (×2): qty 1

## 2023-12-14 MED ORDER — INSULIN ASPART 100 UNIT/ML IJ SOLN: 0.0000 [IU] | Freq: Every day | INTRAMUSCULAR | Status: DC

## 2023-12-14 MED ORDER — SODIUM CHLORIDE 0.9 % IV SOLN
1.0000 g | Freq: Once | INTRAVENOUS | Status: AC
  Administered 2023-12-14: 1 g via INTRAVENOUS
  Filled 2023-12-14: qty 10

## 2023-12-14 MED ORDER — MORPHINE SULFATE (PF) 2 MG/ML IV SOLN
2.0000 mg | INTRAVENOUS | Status: DC | PRN
Start: 2023-12-14 — End: 2023-12-15

## 2023-12-14 MED ORDER — LORAZEPAM 1 MG PO TABS
1.0000 mg | ORAL_TABLET | Freq: Once | ORAL | Status: AC
  Administered 2023-12-14: 1 mg via ORAL
  Filled 2023-12-14: qty 1

## 2023-12-14 MED ORDER — OXYCODONE HCL 5 MG PO TABS
5.0000 mg | ORAL_TABLET | ORAL | Status: DC | PRN
  Administered 2023-12-15 – 2023-12-16 (×4): 5 mg via ORAL
  Filled 2023-12-14 (×4): qty 1

## 2023-12-14 MED ORDER — SODIUM CHLORIDE 0.9 % IV BOLUS
500.0000 mL | Freq: Once | INTRAVENOUS | Status: AC
  Administered 2023-12-14: 500 mL via INTRAVENOUS

## 2023-12-14 MED ORDER — FENTANYL CITRATE (PF) 100 MCG/2ML IJ SOLN
50.0000 ug | Freq: Once | INTRAMUSCULAR | Status: AC
  Administered 2023-12-14: 50 ug via INTRAVENOUS
  Filled 2023-12-14: qty 2

## 2023-12-14 MED ORDER — ACETAMINOPHEN 500 MG PO TABS: 500.0000 mg | ORAL_TABLET | Freq: Four times a day (QID) | ORAL | Status: DC | PRN

## 2023-12-14 MED ORDER — LIDOCAINE 5 % EX PTCH
1.0000 | MEDICATED_PATCH | CUTANEOUS | Status: DC
  Administered 2023-12-14 – 2023-12-15 (×2): 1 via TRANSDERMAL
  Filled 2023-12-14 (×2): qty 1

## 2023-12-14 MED ORDER — PROCHLORPERAZINE EDISYLATE 10 MG/2ML IJ SOLN: 5.0000 mg | Freq: Four times a day (QID) | INTRAMUSCULAR | Status: DC | PRN

## 2023-12-14 NOTE — H&P (Incomplete)
 History and Physical  Shelley Jenkins FMW:[id number] DOB: 1955-10-10 DOA: 12/14/2023  Referring physician: Suellen Cantor, PA-EDP  PCP: Nichole Senior, MD  Outpatient Specialists: GI, urology. Patient coming from: Home.  Chief Complaint: Fall.  HPI: Shelley Jenkins is a 68 y.o. female with medical history significant for hypertension, hyperlipidemia, type 2 diabetes, hypothyroidism, chronic anxiety/depression, history of bladder cancer, who presents to the ER after a mechanical fall at home.  She tripped over a step-stool that was left on the floor by family member.  No history of frequent falls.  Reported some increase in urinary frequency and foul-smelling urine.  No subjective fevers or chills.  In the ER, right hip reveals no acute fracture or dislocation.  Moderate bilateral hip arthritic changes.  Abdomen and pelvis without contrast revealed moderate left hydronephroureter to the level of the urinary bladder.  No obstructing stone.  The possibility of a stricture or urothelial lesion at the left UVJ is not excluded.  Urology referral is advised.  Small nonobstructing bilateral renal calculi.  Mild fatty liver.  Small left adrenal adenoma.  Aortic atherosclerosis.  The patient received multiple doses of IV pain medication.  Unable to ambulate while in the ER.  EDP requested admission for pain control.  UA positive for pyuria.  Rocephin was initiated in the ER.  ED Course: Temperature 97.6.  BP 132/70, pulse 84, respiratory 18, O2 saturation 94% on room air.  Review of Systems: Review of systems as noted in the HPI. All other systems reviewed and are negative.   Past Medical History:  Diagnosis Date   Allergy    seasonal allergies   Anxiety    on meds   Arthritis    Blood transfusion without reported diagnosis    hysterectomy   Cancer (HCC)    bladder   Cataract    bilateral sx   Depression    on meds   Diabetes mellitus without complication (HCC)    on meds   Difficult airway for  intubation, initial encounter 06/16/2021   DLx4 by student, CRNA, & MDA, then glidescope   GERD (gastroesophageal reflux disease)    Hypercholesteremia    on meds   Hypertension    on meds   Hypothyroidism    on meds   PONV (postoperative nausea and vomiting)    Past Surgical History:  Procedure Laterality Date   ABDOMINAL HYSTERECTOMY     APPENDECTOMY     yrs ago done with ovary removal   BIOPSY  05/05/2022   Procedure: BIOPSY;  Surgeon: Cindie Carlin POUR, DO;  [facility];  Service: Endoscopy;;   BREAST SURGERY Right    bening lump removed   CATARACT EXTRACTION W/PHACO Left 11/28/2012   Procedure: CATARACT EXTRACTION PHACO AND INTRAOCULAR LENS PLACEMENT (IOC);  Surgeon: Cherene Mania, MD;  Location: [facility];  Service: Ophthalmology;  Laterality: Left;  CDE:  11.60   CATARACT EXTRACTION W/PHACO Right 12/08/2012   Procedure: CATARACT EXTRACTION PHACO AND INTRAOCULAR LENS PLACEMENT (IOC);  Surgeon: Cherene Mania, MD;  Location: [facility];  Service: Ophthalmology;  Laterality: Right;  CDE:17.33   CHOLECYSTECTOMY     COLONOSCOPY  2016   DB-moviprep (good)-TA x 1   COLONOSCOPY N/A 09/14/2023   Procedure: COLONOSCOPY;  Surgeon: Cindie Carlin POUR, DO;  [facility]: [facility];  Service: Endoscopy;  Laterality: N/A;  9:45 am, asa 2, pt knows to arrive at 7:15   COLONOSCOPY WITH PROPOFOL  N/A 05/05/2022   Procedure: COLONOSCOPY WITH PROPOFOL ;  Surgeon: Cindie,  Carlin POUR, DO;  Location: [facility];  Service: Endoscopy;  Laterality: N/A;  11:00am, asa 2, pt knows to arrive at 6:30   CYSTOSCOPY W/ RETROGRADES Bilateral 09/28/2022   Procedure: CYSTOSCOPY WITH BILATERAL RETROGRADE PYELOGRAM;  Surgeon: Carolee Sherwood JONETTA DOUGLAS, MD;  Location: [facility];  Service: Urology;  Laterality: Bilateral;   CYSTOSCOPY W/ URETERAL STENT PLACEMENT Left 06/16/2021   Procedure: CYSTOSCOPY WITH RETROGRADE PYELOGRAM/URETERAL STENT PLACEMENT;  Surgeon: Carolee Sherwood JONETTA DOUGLAS, MD;  Location: [facility];   Service: Urology;  Laterality: Left;   POLYPECTOMY  2016   1 TA   POLYPECTOMY  05/05/2022   Procedure: POLYPECTOMY;  Surgeon: Cindie Carlin POUR, DO;  [facility];  Service: Endoscopy;;   TRANSURETHRAL RESECTION OF BLADDER TUMOR WITH MITOMYCIN -C N/A 06/16/2021   Procedure: TRANSURETHRAL RESECTION OF BLADDER TUMOR;  Surgeon: Carolee Sherwood JONETTA DOUGLAS, MD;  Location: [facility];  Service: Urology;  Laterality: N/A;  1 HR FOR CASE    Social History:  reports that she has never smoked. She has never been exposed to tobacco smoke. She has never used smokeless tobacco. She reports that she does not drink alcohol and does not use drugs.   Allergies  Allergen Reactions   Penicillins Hives    Family History  Problem Relation Age of Onset   Stomach cancer Maternal Grandmother        ?stomach CA-unknown type   Colon cancer Neg Hx    Colon polyps Neg Hx    Esophageal cancer Neg Hx    Rectal cancer Neg Hx       Prior to Admission medications   Medication Sig Start Date End Date Taking? Authorizing Provider  atorvastatin (LIPITOR) 20 MG tablet Take 20 mg by mouth daily.    [provider]  escitalopram (LEXAPRO) 10 MG tablet Take 10 mg by mouth daily. 07/21/23   [provider]  JARDIANCE 25 MG TABS tablet Take 25 mg by mouth daily. 02/12/20   [provider]  levothyroxine (SYNTHROID, LEVOTHROID) 50 MCG tablet Take 50 mcg by mouth daily before breakfast.    [provider]  LORazepam (ATIVAN) 0.5 MG tablet Take 1 mg by mouth at bedtime.    [provider]  losartan (COZAAR) 100 MG tablet Take 50 mg by mouth daily.    [provider]  lubiprostone  (AMITIZA ) 24 MCG capsule TAKE 1 CAPSULE BY MOUTH 2 TIMES DAILY WITH A MEAL 09/03/23   Carver, Charles K, DO  Melatonin 10 MG TABS Take 10 mg by mouth at bedtime.    [provider]  metFORMIN (GLUCOPHAGE) 500 MG tablet Take 500 mg by mouth 2 (two) times daily with a meal.    [provider]  mupirocin ointment (BACTROBAN) 2 % Apply 1 Application topically 3 (three) times daily. 07/29/23   [provider]  TRESIBA FLEXTOUCH 100 UNIT/ML FlexTouch Pen Inject 48 Units into the skin daily. 06/24/23   [provider]  TRULICITY 3 MG/0.5ML SOPN Inject 3 mg into the skin every Sunday. 11/08/19   [provider]  venlafaxine XR (EFFEXOR-XR) 150 MG 24 hr capsule Take 150 mg by mouth 2 (two) times daily.    [provider]    Physical Exam: BP 130/73   Pulse 97   Temp 98 F (36.7 C)   Resp 16   Ht 5' 2 (1.575 m)   Wt 59 kg   SpO2 95%   BMI 23.78 kg/m   General: 68  y.o. year-old female well developed well nourished in no acute distress.  Alert and oriented x3. Cardiovascular: Regular rate and rhythm with no rubs or gallops.  No thyromegaly or JVD noted.  No lower extremity edema. 2/4 pulses in all 4 extremities. Respiratory: Clear to auscultation with no wheezes or rales. Good inspiratory effort. Abdomen: Soft nontender nondistended with normal bowel sounds x4 quadrants. Muskuloskeletal: No cyanosis, clubbing or edema noted bilaterally Neuro: CN II-XII intact, strength, sensation, reflexes Skin: No ulcerative lesions noted or rashes Psychiatry: Judgement and insight appear normal. Mood is appropriate for condition and setting          Labs on Admission:  Basic Metabolic Panel: Recent Labs  Lab 12/14/23 1435  NA 143  K 4.3  CL 101  CO2 25  GLUCOSE 131*  BUN 15  CREATININE 0.81  CALCIUM 10.5*   Liver Function Tests: Recent Labs  Lab 12/14/23 1435  AST 21  ALT 22  ALKPHOS 103  BILITOT 0.6  PROT 8.0  ALBUMIN 4.2   No results for input(s): LIPASE, AMYLASE in the last 168 hours. No results for input(s): AMMONIA in the last 168 hours. CBC: Recent Labs  Lab 12/14/23 1435  WBC 11.9*  NEUTROABS 9.1*  HGB 14.5  HCT 43.9  MCV 95.9  PLT 269   Cardiac Enzymes: No results for input(s): CKTOTAL, CKMB,  CKMBINDEX, TROPONINI in the last 168 hours.  BNP (last 3 results) No results for input(s): BNP in the last 8760 hours.  ProBNP (last 3 results) No results for input(s): PROBNP in the last 8760 hours.  CBG: No results for input(s): GLUCAP in the last 168 hours.  Radiological Exams on Admission: CT ABDOMEN PELVIS WO CONTRAST Result Date: 12/14/2023 CLINICAL DATA:  Dilated left ureter seen on pelvic CT. EXAM: CT ABDOMEN AND PELVIS WITHOUT CONTRAST TECHNIQUE: Multidetector CT imaging of the abdomen and pelvis was performed following the standard protocol without IV contrast. RADIATION DOSE REDUCTION: This exam was performed according to the departmental dose-optimization program which includes automated exposure control, adjustment of the mA and/or kV according to patient size and/or use of iterative reconstruction technique. COMPARISON:  CT abdomen pelvis dated 05/06/2021. FINDINGS: Evaluation of this exam is limited in the absence of intravenous contrast. Lower chest: No acute abnormality. No intra-abdominal free air or free fluid. Hepatobiliary: Mild fatty liver. No biliary dilatation. Cholecystectomy. Pancreas: Unremarkable. No pancreatic ductal dilatation or surrounding inflammatory changes. Spleen: Normal in size without focal abnormality. Adrenals/Urinary Tract: The right adrenal glands unremarkable. There is a 1 cm left adrenal adenoma. There is a 3 mm nonobstructing left renal interpolar calculus. A punctate nonobstructing stone noted in the interpolar right kidney. No obstructing stone. There is moderate left hydronephrosis and hydroureter to the level of the urinary bladder. The possibility of a stricture or urothelial lesion at the left UVJ is not excluded. Urology referral is advised. There is no hydronephrosis on the right. The right ureter and urinary bladder appear unremarkable. Stomach/Bowel: There is moderate stool throughout the colon. There is no bowel obstruction.  Appendectomy. Vascular/Lymphatic: Mild atherosclerotic calcification of the abdominal aorta. The IVC is unremarkable. No portal venous gas. There is no adenopathy. Reproductive: Hysterectomy.  No suspicious adnexal masses. Other: None Musculoskeletal: Osteopenia.  No acute osseous pathology. IMPRESSION: 1. Moderate left hydronephroureter to the level of the urinary bladder. No obstructing stone. The possibility of a stricture or urothelial lesion at the left UVJ is not excluded. Urology referral is advised. 2. Small nonobstructing bilateral renal calculi. 3.  Mild fatty liver. 4. Small left adrenal adenoma. 5.  Aortic Atherosclerosis (ICD10-I70.0). Electronically Signed   By: Vanetta Chou M.D.   On: 12/14/2023 19:10   CT PELVIS WO CONTRAST Result Date: 12/14/2023 CLINICAL DATA:  Fall today with right hip pain. Suspect hip fracture. EXAM: CT PELVIS WITHOUT CONTRAST TECHNIQUE: Multidetector CT imaging of the pelvis was performed following the standard protocol without intravenous contrast. RADIATION DOSE REDUCTION: This exam was performed according to the departmental dose-optimization program which includes automated exposure control, adjustment of the mA and/or kV according to patient size and/or use of iterative reconstruction technique. COMPARISON:  Plain films earlier today.  CT 05/06/2021 FINDINGS: Urinary Tract: Moderate dilatation of the visualized left ureter measuring to 2.1 cm in diameter. This is new compared to the prior CT from 2023. Right ureter and bladder are normal. Bowel:  Normal. Vascular/Lymphatic: Visualized vascular structures are unremarkable. No evidence of adenopathy. Reproductive:  Prior sternotomy.  Adnexal regions are unremarkable. Other:  No free pelvic fluid. Musculoskeletal: No evidence of acute fracture or dislocation involving the right hip. Left hip and remaining pelvic bones demonstrate no evidence of acute fracture. Bone island over the right iliac bone. Mild degenerative  change of the spine. Subtle grade 1 anterolisthesis of L4 on L5 unchanged. IMPRESSION: 1. No acute fracture or dislocation involving the right hip. 2. Moderate dilatation of the visualized left ureter measuring to 2.1 cm in diameter. This is new compared to the prior CT from 2023. Recommend clinical correlation and consider further evaluation with CT abdomen/pelvis without contrast. Electronically Signed   By: Toribio Agreste M.D.   On: 12/14/2023 16:00   DG Hip Unilat W or Wo Pelvis 2-3 Views Right Result Date: 12/14/2023 CLINICAL DATA:  Fall and right hip pain. EXAM: DG HIP (WITH OR WITHOUT PELVIS) 2-3V RIGHT COMPARISON:  None Available. FINDINGS: There is no acute fracture or dislocation. The bones are osteopenic. Moderate bilateral hip arthritic changes. The soft tissues are unremarkable. Sclerotic focus in the right iliac bone present on the CT of 05/06/2021. IMPRESSION: 1. No acute fracture or dislocation. 2. Moderate bilateral hip arthritic changes. Electronically Signed   By: Vanetta Chou M.D.   On: 12/14/2023 14:27    EKG: I independently viewed the EKG done and my findings are as followed: None available at the time of this visit.  Assessment/Plan Present on Admission:  Fall  Principal Problem:   Fall  Mechanical fall, POA No fracture seen on x-ray. Pain control as needed PT OT evaluation Fall precautions.  Presumed UTI, POA Follow urine culture Continue Rocephin empirically.  Chronic anxiety/depression Resume home regimen.  Hyperlipidemia Resume home regimen  Type 2 diabetes with hyperlipidemia Last hemoglobin A1c 8.0 on 06/13/2021. Insulin coverage.  Hypothyroidism Resume home levothyroxine.  Generalized weakness PT OT evaluation Fall precautions   Time: 75 minutes.   DVT prophylaxis: Subcu Lovenox daily.  Code Status: Full code.  Family Communication: Significant other at bedside.  Disposition Plan: Admitted to MedSurg unit.  Consults called:  None.  Admission status: Observation status.   Status is: Observation    Terry LOISE Hurst MD [facility] Pager [phone]  If 7PM-7AM, please contact night-coverage [email or web address] Password [email or web address]  12/14/2023, 9:18 PM

## 2023-12-14 NOTE — ED Triage Notes (Signed)
 Pt tripped over a stool and fell today. C/o rt hip pain , denies hitting her head with fall. Pt concerned about UTI as well.

## 2023-12-14 NOTE — Plan of Care (Signed)

## 2023-12-14 NOTE — ED Provider Notes (Signed)
 Jenkins Jenkins AT [facility] Provider Note   CSN: [id number] Arrival date & time: 12/14/23  1341     Patient presents with: Jenkins Jenkins Jenkins Jenkins is a 68 y.o. female.  She has history of diabetes and is on Tresiba, and Trulicity weekly, high cholesterol, GERD, hypothyroidism, bladder cancer.  Presents to the ER today for evaluation of right hip pain.  She states she was walking and tripped over a stool today, fell to the right side was having right hip pain.  She is able to stand up and ambulate after this.  She reports after getting the car and riding for her.  She is not able to stand up or bear any weight and pain is now 10 out of 10 with movement but about 3 out of 10 at rest.  She denies fever or chills.  She has reports history of frequent UTIs and is having cloudy urine and would like this to be checked.  No fevers or chills, no abdominal pain, no nausea or vomiting, no dizziness, no LOC.  She reports she has had decreased appetite for the past couple of days, coinciding with taking her Trulicity which is not out of the ordinary for her.    Fall       Prior to Admission medications   Medication Sig Start Date End Date Taking? Authorizing Provider  atorvastatin (LIPITOR) 20 MG tablet Take 20 mg by mouth daily.    [provider]  escitalopram (LEXAPRO) 10 MG tablet Take 10 mg by mouth daily. 07/21/23   [provider]  JARDIANCE 25 MG TABS tablet Take 25 mg by mouth daily. 02/12/20   [provider]  levothyroxine (SYNTHROID, LEVOTHROID) 50 MCG tablet Take 50 mcg by mouth daily before breakfast.    [provider]  LORazepam (ATIVAN) 0.5 MG tablet Take 1 mg by mouth at bedtime.    [provider]  losartan (COZAAR) 100 MG tablet Take 50 mg by mouth daily.    [provider]  lubiprostone  (AMITIZA ) 24 MCG capsule TAKE 1 CAPSULE BY MOUTH 2 TIMES DAILY WITH A MEAL 09/03/23   Carver, Charles K, DO   Melatonin 10 MG TABS Take 10 mg by mouth at bedtime.    [provider]  metFORMIN (GLUCOPHAGE) 500 MG tablet Take 500 mg by mouth 2 (two) times daily with a meal.    [provider]  mupirocin ointment (BACTROBAN) 2 % Apply 1 Application topically 3 (three) times daily. 07/29/23   [provider]  TRESIBA FLEXTOUCH 100 UNIT/ML FlexTouch Pen Inject 48 Units into the skin daily. 06/24/23   [provider]  TRULICITY 3 MG/0.5ML SOPN Inject 3 mg into the skin every Sunday. 11/08/19   [provider]  venlafaxine XR (EFFEXOR-XR) 150 MG 24 hr capsule Take 150 mg by mouth 2 (two) times daily.    [provider]    Allergies: Penicillins    Review of Systems  Updated Vital Signs BP 135/80 (BP Location: Left Arm)   Pulse (!) 115   Temp 98.1 F (36.7 C) (Oral)   Resp 17   Ht 5' 2 (1.575 m)   Wt 59 kg   SpO2 99%   BMI 23.78 kg/m   Physical Exam Vitals and nursing note reviewed.  Constitutional:      General: She is not in acute distress.    Appearance: She is well-developed.  HENT:     Head: Normocephalic and  atraumatic.     Mouth/Throat:     Mouth: Mucous membranes are moist.  Eyes:     Extraocular Movements: Extraocular movements intact.     Conjunctiva/sclera: Conjunctivae normal.     Pupils: Pupils are equal, round, and reactive to light.  Cardiovascular:     Rate and Rhythm: Normal rate and regular rhythm.     Heart sounds: No murmur heard. Pulmonary:     Effort: Pulmonary effort is normal. No respiratory distress.     Breath sounds: Normal breath sounds.  Abdominal:     Palpations: Abdomen is soft.     Tenderness: There is no abdominal tenderness.  Musculoskeletal:        General: No swelling.     Cervical back: Normal range of motion and neck supple. No tenderness.  Skin:    General: Skin is warm and dry.     Capillary Refill: Capillary refill takes less than 2 seconds.  Neurological:     General: No focal deficit  present.     Mental Status: She is alert and oriented to person, place, and time.  Psychiatric:        Mood and Affect: Mood normal.     (all labs ordered are listed, but only abnormal results are displayed) Labs Reviewed - No data to display  EKG: None  Radiology: No results found.   Procedures   Medications Ordered in the ED - No data to display                                  Medical Decision Making Differential diagnosis includes but not limited to fracture, sprain, strain, contusion, dislocation, other  Labs: UA with positive nitrate, small leukocytes, greater than 50 white blood cells per high-power field, many bacteria CMP with mild hypocalcemia and increased anion gap at 17 with normal CO2, CBC with white blood count 11.9  Imaging: X-ray right hip with pelvis shows no fracture or dislocation  CT pelvis without contrast showed no fracture or dislocation but did show right hydroureter he recommended CT abdomen pelvis without contrast which showed right hydronephroureter  ED course: Patient presents with mechanical fall after she tripped and fell over a stool today, no head injury.  Does not blood thinners was having severe pain and not able to ambulate, there is no fracture on plain film so proceeded with CT which showed no fracture as well but did show hydroureter, recommended CT of pelvis for further evaluation, patient has left hydroureteronephrosis.  She was also concerned about UTI due to some dysuria and cloudy urine and has UTI, given that she cannot ambulate despite analgesia we will plan for admission and treat UTI.  Consults: Consulted hospitalist Dr. Shona who will admit  Discussed admission with patient she is grateful for admission  Amount and/or Complexity of Data Reviewed Labs: ordered. Radiology: ordered.  Risk Prescription drug management. Decision regarding hospitalization.        Final diagnoses:  None    ED Discharge Orders     None           Jenkins Jenkins Jenkins Jenkins 12/14/23 2248    Shelley Ozell BROCKS, MD 12/15/23 1006

## 2023-12-15 ENCOUNTER — Observation Stay (HOSPITAL_COMMUNITY)

## 2023-12-15 ENCOUNTER — Encounter (HOSPITAL_COMMUNITY): Payer: Self-pay | Admitting: Radiology

## 2023-12-15 DIAGNOSIS — W19XXXD Unspecified fall, subsequent encounter: Secondary | ICD-10-CM | POA: Diagnosis not present

## 2023-12-15 DIAGNOSIS — N3090 Cystitis, unspecified without hematuria: Secondary | ICD-10-CM

## 2023-12-15 DIAGNOSIS — M25551 Pain in right hip: Secondary | ICD-10-CM

## 2023-12-15 LAB — GLUCOSE, CAPILLARY
Glucose-Capillary: 55 mg/dL — ABNORMAL LOW (ref 70–99)
Glucose-Capillary: 85 mg/dL (ref 70–99)
Glucose-Capillary: 130 mg/dL — ABNORMAL HIGH (ref 70–99)
Glucose-Capillary: 254 mg/dL — ABNORMAL HIGH (ref 70–99)
Glucose-Capillary: 165 mg/dL — ABNORMAL HIGH (ref 70–99)

## 2023-12-15 LAB — BASIC METABOLIC PANEL WITH GFR
Anion gap: 13 (ref 5–15)
BUN: 14 mg/dL (ref 8–23)
CO2: 25 mmol/L (ref 22–32)
Calcium: 9.4 mg/dL (ref 8.9–10.3)
Chloride: 105 mmol/L (ref 98–111)
Creatinine, Ser: 0.63 mg/dL (ref 0.44–1.00)
GFR, Estimated: >60 mL/min (ref >60)
Glucose, Bld: 44 mg/dL — CL (ref 70–99)
Potassium: 3.3 mmol/L — ABNORMAL LOW (ref 3.5–5.1)
Sodium: 143 mmol/L (ref 135–145)

## 2023-12-15 LAB — MAGNESIUM: Magnesium: 2.4 mg/dL (ref 1.7–2.4)

## 2023-12-15 LAB — CBC
HCT: 42.4 % (ref 36.0–46.0)
Hemoglobin: 13.7 g/dL (ref 12.0–15.0)
MCH: 31.4 pg (ref 26.0–34.0)
MCHC: 32.3 g/dL (ref 30.0–36.0)
MCV: 97 fL (ref 80.0–100.0)
Platelets: 231 K/uL (ref 150–400)
RBC: 4.37 MIL/uL (ref 3.87–5.11)
RDW: 13.8 % (ref 11.5–15.5)
WBC: 8.8 K/uL (ref 4.0–10.5)
nRBC: 0 % (ref 0.0–0.2)

## 2023-12-15 LAB — PHOSPHORUS: Phosphorus: 3.4 mg/dL (ref 2.5–4.6)

## 2023-12-15 LAB — HEMOGLOBIN A1C
Hgb A1c MFr Bld: 6.5 % — ABNORMAL HIGH (ref 4.8–5.6)
Mean Plasma Glucose: 139.85 mg/dL

## 2023-12-15 LAB — HIV ANTIBODY (ROUTINE TESTING W REFLEX): HIV Screen 4th Generation wRfx: NONREACTIVE

## 2023-12-15 LAB — CK TOTAL AND CKMB (NOT AT ARMC)
CK, MB: 2 ng/mL (ref 0.5–5.0)
Total CK: 28 U/L — ABNORMAL LOW (ref 38–234)

## 2023-12-15 MED ORDER — LORAZEPAM 1 MG PO TABS
1.0000 mg | ORAL_TABLET | Freq: Once | ORAL | Status: AC
  Administered 2023-12-15: 1 mg via ORAL
  Filled 2023-12-15: qty 1

## 2023-12-15 MED ORDER — SODIUM CHLORIDE 0.9 % IV BOLUS
500.0000 mL | Freq: Once | INTRAVENOUS | Status: AC
  Administered 2023-12-15: 500 mL via INTRAVENOUS

## 2023-12-15 MED ORDER — IBUPROFEN 600 MG PO TABS: 600.0000 mg | ORAL_TABLET | Freq: Three times a day (TID) | ORAL | Status: DC

## 2023-12-15 MED ORDER — TIZANIDINE HCL 4 MG PO TABS: 4.0000 mg | ORAL_TABLET | Freq: Three times a day (TID) | ORAL | Status: DC

## 2023-12-15 MED ORDER — ESCITALOPRAM OXALATE 10 MG PO TABS
10.0000 mg | ORAL_TABLET | Freq: Every day | ORAL | Status: DC
  Administered 2023-12-15 – 2023-12-16 (×2): 10 mg via ORAL
  Filled 2023-12-15 (×2): qty 1

## 2023-12-15 MED ORDER — TIZANIDINE HCL 4 MG PO TABS
4.0000 mg | ORAL_TABLET | Freq: Three times a day (TID) | ORAL | Status: DC
  Administered 2023-12-15 – 2023-12-16 (×3): 4 mg via ORAL
  Filled 2023-12-15 (×3): qty 1

## 2023-12-15 MED ORDER — SODIUM CHLORIDE 0.9 % IV SOLN
1.0000 g | INTRAVENOUS | Status: DC
  Administered 2023-12-15 – 2023-12-16 (×2): 1 g via INTRAVENOUS
  Filled 2023-12-15 (×2): qty 10

## 2023-12-15 MED ORDER — IBUPROFEN 600 MG PO TABS
600.0000 mg | ORAL_TABLET | Freq: Three times a day (TID) | ORAL | Status: DC
  Administered 2023-12-15 – 2023-12-16 (×3): 600 mg via ORAL
  Filled 2023-12-15 (×3): qty 1

## 2023-12-15 MED ORDER — FLORANEX PO PACK
1.0000 g | PACK | Freq: Three times a day (TID) | ORAL | Status: DC
  Administered 2023-12-15 – 2023-12-16 (×4): 1 g via ORAL
  Filled 2023-12-15 (×4): qty 1

## 2023-12-15 MED ORDER — IBUPROFEN 600 MG PO TABS: 600.0000 mg | ORAL_TABLET | Freq: Four times a day (QID) | ORAL | Status: DC | PRN

## 2023-12-15 MED ORDER — ATORVASTATIN CALCIUM 20 MG PO TABS
20.0000 mg | ORAL_TABLET | Freq: Every day | ORAL | Status: DC
  Administered 2023-12-15 – 2023-12-16 (×2): 20 mg via ORAL
  Filled 2023-12-15 (×2): qty 1

## 2023-12-15 MED ORDER — ACETAMINOPHEN 500 MG PO TABS
1000.0000 mg | ORAL_TABLET | Freq: Two times a day (BID) | ORAL | Status: DC
  Administered 2023-12-15 – 2023-12-16 (×3): 1000 mg via ORAL
  Filled 2023-12-15 (×3): qty 2

## 2023-12-15 MED ORDER — HYDROMORPHONE HCL 1 MG/ML IJ SOLN
0.5000 mg | INTRAMUSCULAR | Status: DC | PRN
Start: 2023-12-15 — End: 2023-12-16

## 2023-12-15 MED ORDER — IOHEXOL 300 MG/ML  SOLN
125.0000 mL | Freq: Once | INTRAMUSCULAR | Status: AC | PRN
  Administered 2023-12-15: 125 mL via INTRAVENOUS

## 2023-12-15 MED ORDER — POTASSIUM CHLORIDE CRYS ER 20 MEQ PO TBCR
40.0000 meq | EXTENDED_RELEASE_TABLET | Freq: Once | ORAL | Status: AC
  Administered 2023-12-15: 40 meq via ORAL
  Filled 2023-12-15: qty 2

## 2023-12-15 NOTE — Plan of Care (Signed)
  Problem: Acute Rehab PT Goals(only PT should resolve) Goal: Pt Will Go Supine/Side To Sit Outcome: Progressing Flowsheets (Taken 12/15/2023 1452) Pt will go Supine/Side to Sit:  with contact guard assist  with minimal assist Goal: Patient Will Transfer Sit To/From Stand Outcome: Progressing Flowsheets (Taken 12/15/2023 1452) Patient will transfer sit to/from stand:  with supervision  with contact guard assist Goal: Pt Will Transfer Bed To Chair/Chair To Bed Outcome: Progressing Flowsheets (Taken 12/15/2023 1452) Pt will Transfer Bed to Chair/Chair to Bed:  with supervision  with contact guard assist Goal: Pt Will Ambulate Outcome: Progressing Flowsheets (Taken 12/15/2023 1452) Pt will Ambulate:  50 feet  with minimal assist  with contact guard assist  with rolling walker   2:54 PM, 12/15/23 Lynwood Music, MPT Physical Therapist with [facility] [phone] office [phone] mobile phone

## 2023-12-15 NOTE — Plan of Care (Signed)

## 2023-12-15 NOTE — Evaluation (Signed)
 Physical Therapy Evaluation Patient Details Name: Shelley Jenkins MRN: [id number] DOB: [birth year] Today's Date: 12/15/2023  History of Present Illness  Shelley Jenkins is a 68 y.o. female with medical history significant for hypertension, hyperlipidemia, type 2 diabetes, hypothyroidism, chronic anxiety/depression, history of bladder cancer, who presents to the ER after a mechanical fall at home.  She tripped over a step-stool that was left on the floor by family member.  No history of frequent falls.  Reported some increase in urinary frequency and foul-smelling urine.  No subjective fevers or chills.     In the ER, right hip reveals no acute fracture or dislocation.  Moderate bilateral hip arthritic changes.  Abdomen and pelvis without contrast revealed moderate left hydronephroureter to the level of the urinary bladder.  No obstructing stone.  The possibility of a stricture or urothelial lesion at the left UVJ is not excluded.  Urology referral is advised.  Small nonobstructing bilateral renal calculi.  Mild fatty liver.  Small left adrenal adenoma.  Aortic atherosclerosis.     The patient received multiple doses of IV pain medication.  Unable to ambulate while in the ER.  EDP requested admission for pain control.  UA positive for pyuria.  Rocephin was initiated in the ER.   Clinical Impression  Patient demonstrates slow labored movement for sitting up at bedside with c/o  increased right sided  lower abdominal pain, unsteady on feet and limited to a few steps at bedside before requesting to sit due fatigue and spasms in lower right abdomen. Patient tolerated sitting up in chair after therapy with her friend present. Patient will benefit from continued skilled physical therapy in hospital and recommended venue below to increase strength, balance, endurance for safe ADLs and gait.           If plan is discharge home, recommend the following: A lot of help with walking and/or transfers;Help with stairs or ramp  for entrance;A lot of help with bathing/dressing/bathroom;Assist for transportation;Assistance with cooking/housework   Can travel by private vehicle        Equipment Recommendations Rolling walker (2 wheels);BSC/3in1  Recommendations for Other Services       Functional Status Assessment Patient has had a recent decline in their functional status and demonstrates the ability to make significant improvements in function in a reasonable and predictable amount of time.     Precautions / Restrictions Precautions Precautions: Fall Recall of Precautions/Restrictions: Intact Restrictions Weight Bearing Restrictions Per Provider Order: No      Mobility  Bed Mobility Overal bed mobility: Needs Assistance Bed Mobility: Rolling, Sit to Sidelying Rolling: Min assist, Mod assist       Sit to sidelying: Mod assist General bed mobility comments: slow labored movement with c/o increasing right lower abdominal, groin pain    Transfers                        Ambulation/Gait Ambulation/Gait assistance: Mod assist Gait Distance (Feet): 10 Feet Assistive device: Rolling walker (2 wheels) Gait Pattern/deviations: Decreased step length - left, Decreased stance time - right, Decreased stride length, Antalgic Gait velocity: slow     General Gait Details: limited to a few steps forward/backwards at bedside before having to sit due to increasing right hip/lower abdominal pain  Stairs            Wheelchair Mobility     Tilt Bed    Modified Rankin (Stroke Patients Only)  Balance Overall balance assessment: Needs assistance Sitting-balance support: Feet supported, No upper extremity supported Sitting balance-Leahy Scale: Fair Sitting balance - Comments: fair/good seated at EOB   Standing balance support: Reliant on assistive device for balance, During functional activity, Bilateral upper extremity supported Standing balance-Leahy Scale: Poor Standing balance  comment: fair/poor                             Pertinent Vitals/Pain Pain Assessment Pain Assessment: Faces Faces Pain Scale: Hurts whole lot Pain Location: right lower abdomen, groin area Pain Descriptors / Indicators: Grimacing, Guarding, Spasm Pain Intervention(s): Limited activity within patient's tolerance, Monitored during session, Repositioned, Patient requesting pain meds-RN notified    Home Living Family/patient expects to be discharged to:: Private residence Living Arrangements: Spouse/significant other Available Help at Discharge: Family;Available 24 hours/day Type of Home: House Home Access: Stairs to enter Entrance Stairs-Rails: None Entrance Stairs-Number of Steps: 1   Home Layout: One level Home Equipment: None      Prior Function Prior Level of Function : Independent/Modified Independent;Driving             Mobility Comments: Community ambulation without AD, drives, shops ADLs Comments: Independent     Extremity/Trunk Assessment   Upper Extremity Assessment Upper Extremity Assessment: Generalized weakness    Lower Extremity Assessment Lower Extremity Assessment: Generalized weakness;RLE deficits/detail RLE Deficits / Details: grossly -4/5 RLE: Unable to fully assess due to pain RLE Sensation: WNL RLE Coordination: WNL    Cervical / Trunk Assessment Cervical / Trunk Assessment: Normal  Communication   Communication Communication: No apparent difficulties    Cognition Arousal: Alert Behavior During Therapy: WFL for tasks assessed/performed   PT - Cognitive impairments: No apparent impairments                         Following commands: Intact       Cueing Cueing Techniques: Verbal cues, Tactile cues     General Comments      Exercises     Assessment/Plan    PT Assessment Patient needs continued PT services  PT Problem List Decreased strength;Decreased activity tolerance;Decreased balance;Decreased  mobility       PT Treatment Interventions DME instruction;Gait training;Stair training;Functional mobility training;Therapeutic activities;Therapeutic exercise;Balance training;Patient/family education    PT Goals (Current goals can be found in the Care Plan section)  Acute Rehab PT Goals Patient Stated Goal: return home with family to assist PT Goal Formulation: With patient/family Time For Goal Achievement: 12/17/23 Potential to Achieve Goals: Good    Frequency Min 3X/week     Co-evaluation               AM-PAC PT 6 Clicks Mobility  Outcome Measure Help needed turning from your back to your side while in a flat bed without using bedrails?: A Lot Help needed moving from lying on your back to sitting on the side of a flat bed without using bedrails?: A Lot Help needed moving to and from a bed to a chair (including a wheelchair)?: A Lot Help needed standing up from a chair using your arms (e.g., wheelchair or bedside chair)?: A Little Help needed to walk in hospital room?: A Lot Help needed climbing 3-5 steps with a railing? : Total 6 Click Score: 12    End of Session   Activity Tolerance: Patient tolerated treatment well;Patient limited by fatigue Patient left: in chair;with call bell/phone within reach;with family/visitor present  Nurse Communication: Mobility status PT Visit Diagnosis: Unsteadiness on feet (R26.81);Other abnormalities of gait and mobility (R26.89);Muscle weakness (generalized) (M62.81);Pain Pain - Right/Left: Right Pain - part of body: Hip    Time: 0943-1000 PT Time Calculation (min) (ACUTE ONLY): 17 min   Charges:   PT Evaluation $PT Eval Low Complexity: 1 Low PT Treatments $Therapeutic Activity: 8-22 mins PT General Charges $$ ACUTE PT VISIT: 1 Visit         2:50 PM, 12/15/23 Lynwood Music, MPT Physical Therapist with [facility] [phone] office [phone] mobile phone

## 2023-12-15 NOTE — Progress Notes (Signed)
 PROGRESS NOTE    Patient: Shelley Jenkins                            PCP: Nichole Senior, MD                    DOB: 03-29-1955            DOA: 12/14/2023 FMW:[id number]             DOS: 12/15/2023, 7:09 AM   LOS: 0 days   Date of Service: The patient was seen and examined on 12/15/2023  Subjective:   The patient was seen and examined this morning. Hemodynamically stable.  Still complaining of hip pain-difficulty movement, ambulation  No issues overnight .  Brief Narrative:   Shelley Jenkins is a 68 y.o. female with medical history significant for hypertension, hyperlipidemia, type 2 diabetes, hypothyroidism, chronic anxiety/depression, history of bladder cancer, who presents to the ER after a mechanical fall at home.  She tripped over a step-stool that was left on the floor by family member.  No history of frequent falls.  Reported some increase in urinary frequency and foul-smelling urine.  No subjective fevers or chills.   ER:  right hip reveals no acute fracture or dislocation.  Moderate bilateral hip arthritic changes.  Abdomen and pelvis without contrast revealed moderate left hydronephroureter to the level of the urinary bladder.  No obstructing stone.  The possibility of a stricture or urothelial lesion at the left UVJ is not excluded.  Urology referral is advised.  Small nonobstructing bilateral renal calculi.  Mild fatty liver.  Small left adrenal adenoma.  Aortic atherosclerosis.   The patient received multiple doses of IV pain medication.  Unable to ambulate while in the ER.  EDP requested admission for pain control.  UA positive for pyuria.  Rocephin was initiated in the ER.    Assessment & Plan:   Principal Problem:   Fall   Mechanical fall, POA -right hip pain -Remains significantly pain fall, optimizing analgesics, adding muscle relaxants, anti-inflammatories No fracture seen on x-ray. Considering obtaining CT of the hip PT OT evaluation Fall precautions.   Presumed  UTI, POA -CT scan revealing  revealed moderate left hydronephroureter to the level of the urinary bladder.  No obstructing stone.  The possibility of a stricture or urothelial lesion at the left UVJ is not excluded.  - Consulted urology Dr. Sherrilee for further evaluation recommendations -Will follow the urine cultures Continue Rocephin empirically.  Note: Per patient has a history of bladder cancer, status posttreatment approximately 2 years ago follow-up as needed with urologist   Chronic anxiety/depression Resume home regimen.   Hyperlipidemia -holding statins, checking total CK level ruling out rhabdomyolysis   Type 2 diabetes with hyperlipidemia Last hemoglobin A1c 8.0 on 06/13/2021. Checking CBG q. ACHS, SSI coverage   Hypothyroidism- Resume home levothyroxine.   Generalized weakness PT OT evaluation Fall precautions   ----------------------------------------------------------------------------------------------------------------------------------------------- Nutritional status:  The patient's BMI is: Body mass index is 23.61 kg/m. I agree with the assessment and plan as outlined  Nutrition Status:     Cultures; Urine Culture  >>>     --------------------------------------------------------------------------------------------------------------------------------------  DVT prophylaxis:  enoxaparin (LOVENOX) injection 40 mg Start: 12/14/23 2200   Code Status:   Code Status: Full Code  Family Communication: No family member present at bedside-  -Advance care planning has been discussed.   Admission status:   Status is: Observation The  patient remains OBS appropriate and will d/c before 2 midnights.   Disposition: From  - home             Planning for discharge in 1-2 days   Procedures:   No admission procedures for hospital encounter.   Antimicrobials:  Anti-infectives (From admission, onward)    Start     Dose/Rate Route Frequency Ordered Stop    12/15/23 1000  cefTRIAXone (ROCEPHIN) 1 g in sodium chloride  0.9 % 100 mL IVPB        1 g 200 mL/hr over 30 Minutes Intravenous Every 24 hours 12/15/23 0438     12/14/23 2100  cefTRIAXone (ROCEPHIN) 1 g in sodium chloride  0.9 % 100 mL IVPB        1 g 200 mL/hr over 30 Minutes Intravenous  Once 12/14/23 2048 12/14/23 2148        Medication:   atorvastatin  20 mg Oral Daily   enoxaparin (LOVENOX) injection  40 mg Subcutaneous Q24H   escitalopram  10 mg Oral Daily   insulin aspart  0-5 Units Subcutaneous QHS   insulin aspart  0-9 Units Subcutaneous TID WC   lactobacillus  1 g Oral TID WC   lidocaine   1 patch Transdermal Q24H   potassium chloride  40 mEq Oral Once    acetaminophen , HYDROmorphone (DILAUDID) injection, melatonin, oxyCODONE , polyethylene glycol, prochlorperazine   Objective:   Vitals:   12/14/23 1715 12/14/23 2202 12/15/23 0206 12/15/23 0541  BP:  (!) 162/84 133/70 138/74  Pulse: 97 86 84 (!) 102  Resp:  20 18 18   Temp:  98 F (36.7 C) 97.6 F (36.4 C) 97.8 F (36.6 C)  TempSrc:  Oral Oral Oral  SpO2: 95% 100% 94% 98%  Weight:  59.5 kg    Height:  5' 2.5 (1.588 m)      Intake/Output Summary (Last 24 hours) at 12/15/2023 0709 Last data filed at 12/14/2023 2148 Gross per 24 hour  Intake 600 ml  Output --  Net 600 ml   Filed Weights   12/14/23 1348 12/14/23 2202  Weight: 59 kg 59.5 kg     Physical examination:   General:  AAO x 3,  cooperative, no distress;   HEENT:  Normocephalic, PERRL, otherwise with in Normal limits   Neuro:  CNII-XII intact. , normal motor and sensation, reflexes intact   Lungs:   Clear to auscultation BL, Respirations unlabored,  No wheezes / crackles  Cardio:    S1/S2, RRR, No murmure, No Rubs or Gallops   Abdomen:  Soft, non-tender, bowel sounds active all four quadrants, no guarding or peritoneal signs.  Muscular  skeletal:  Limited exam -global generalized weaknesses - in bed, able to move all 4 extremities,   2+  pulses,  symmetric, No pitting edema  Skin:  Dry, warm to touch, negative for any Rashes,  Wounds: Please see nursing documentation       ------------------------------------------------------------------------------------------------------------------------------------------    LABs:     Latest Ref Rng & Units 12/15/2023    4:30 AM 12/14/2023    2:35 PM 09/28/2022    8:04 AM  CBC  WBC 4.0 - 10.5 K/uL 8.8  11.9    Hemoglobin 12.0 - 15.0 g/dL 86.2  85.4  83.6   Hematocrit 36.0 - 46.0 % 42.4  43.9  48.0   Platelets 150 - 400 K/uL 231  269        Latest Ref Rng & Units 12/15/2023    4:30 AM 12/14/2023  2:35 PM 09/28/2022    8:04 AM  CMP  Glucose 70 - 99 mg/dL 44  868  881   BUN 8 - 23 mg/dL 14  15  21    Creatinine 0.44 - 1.00 mg/dL 9.36  9.18  9.39   Sodium 135 - 145 mmol/L 143  143  142   Potassium 3.5 - 5.1 mmol/L 3.3  4.3  4.1   Chloride 98 - 111 mmol/L 105  101  104   CO2 22 - 32 mmol/L 25  25    Calcium 8.9 - 10.3 mg/dL 9.4  89.4    Total Protein 6.5 - 8.1 g/dL  8.0    Total Bilirubin 0.0 - 1.2 mg/dL  0.6    Alkaline Phos 38 - 126 U/L  103    AST 15 - 41 U/L  21    ALT 0 - 44 U/L  22         Micro Results No results found for this or any previous visit (from the past 240 hours).  Radiology Reports CT ABDOMEN PELVIS WO CONTRAST Result Date: 12/14/2023 CLINICAL DATA:  Dilated left ureter seen on pelvic CT. EXAM: CT ABDOMEN AND PELVIS WITHOUT CONTRAST TECHNIQUE: Multidetector CT imaging of the abdomen and pelvis was performed following the standard protocol without IV contrast. RADIATION DOSE REDUCTION: This exam was performed according to the departmental dose-optimization program which includes automated exposure control, adjustment of the mA and/or kV according to patient size and/or use of iterative reconstruction technique. COMPARISON:  CT abdomen pelvis dated 05/06/2021. FINDINGS: Evaluation of this exam is limited in the absence of intravenous contrast. Lower  chest: No acute abnormality. No intra-abdominal free air or free fluid. Hepatobiliary: Mild fatty liver. No biliary dilatation. Cholecystectomy. Pancreas: Unremarkable. No pancreatic ductal dilatation or surrounding inflammatory changes. Spleen: Normal in size without focal abnormality. Adrenals/Urinary Tract: The right adrenal glands unremarkable. There is a 1 cm left adrenal adenoma. There is a 3 mm nonobstructing left renal interpolar calculus. A punctate nonobstructing stone noted in the interpolar right kidney. No obstructing stone. There is moderate left hydronephrosis and hydroureter to the level of the urinary bladder. The possibility of a stricture or urothelial lesion at the left UVJ is not excluded. Urology referral is advised. There is no hydronephrosis on the right. The right ureter and urinary bladder appear unremarkable. Stomach/Bowel: There is moderate stool throughout the colon. There is no bowel obstruction. Appendectomy. Vascular/Lymphatic: Mild atherosclerotic calcification of the abdominal aorta. The IVC is unremarkable. No portal venous gas. There is no adenopathy. Reproductive: Hysterectomy.  No suspicious adnexal masses. Other: None Musculoskeletal: Osteopenia.  No acute osseous pathology. IMPRESSION: 1. Moderate left hydronephroureter to the level of the urinary bladder. No obstructing stone. The possibility of a stricture or urothelial lesion at the left UVJ is not excluded. Urology referral is advised. 2. Small nonobstructing bilateral renal calculi. 3. Mild fatty liver. 4. Small left adrenal adenoma. 5.  Aortic Atherosclerosis (ICD10-I70.0). Electronically Signed   By: Vanetta Chou M.D.   On: 12/14/2023 19:10   CT PELVIS WO CONTRAST Result Date: 12/14/2023 CLINICAL DATA:  Fall today with right hip pain. Suspect hip fracture. EXAM: CT PELVIS WITHOUT CONTRAST TECHNIQUE: Multidetector CT imaging of the pelvis was performed following the standard protocol without intravenous contrast.  RADIATION DOSE REDUCTION: This exam was performed according to the departmental dose-optimization program which includes automated exposure control, adjustment of the mA and/or kV according to patient size and/or use of iterative reconstruction technique. COMPARISON:  Plain films earlier today.  CT 05/06/2021 FINDINGS: Urinary Tract: Moderate dilatation of the visualized left ureter measuring to 2.1 cm in diameter. This is new compared to the prior CT from 2023. Right ureter and bladder are normal. Bowel:  Normal. Vascular/Lymphatic: Visualized vascular structures are unremarkable. No evidence of adenopathy. Reproductive:  Prior sternotomy.  Adnexal regions are unremarkable. Other:  No free pelvic fluid. Musculoskeletal: No evidence of acute fracture or dislocation involving the right hip. Left hip and remaining pelvic bones demonstrate no evidence of acute fracture. Bone island over the right iliac bone. Mild degenerative change of the spine. Subtle grade 1 anterolisthesis of L4 on L5 unchanged. IMPRESSION: 1. No acute fracture or dislocation involving the right hip. 2. Moderate dilatation of the visualized left ureter measuring to 2.1 cm in diameter. This is new compared to the prior CT from 2023. Recommend clinical correlation and consider further evaluation with CT abdomen/pelvis without contrast. Electronically Signed   By: Toribio Agreste M.D.   On: 12/14/2023 16:00   DG Hip Unilat W or Wo Pelvis 2-3 Views Right Result Date: 12/14/2023 CLINICAL DATA:  Fall and right hip pain. EXAM: DG HIP (WITH OR WITHOUT PELVIS) 2-3V RIGHT COMPARISON:  None Available. FINDINGS: There is no acute fracture or dislocation. The bones are osteopenic. Moderate bilateral hip arthritic changes. The soft tissues are unremarkable. Sclerotic focus in the right iliac bone present on the CT of 05/06/2021. IMPRESSION: 1. No acute fracture or dislocation. 2. Moderate bilateral hip arthritic changes. Electronically Signed   By: Vanetta Chou M.D.   On: 12/14/2023 14:27    SIGNED: Adriana DELENA Grams, MD, FHM. FAAFP. Jolynn Pack - [facility] Time spent - 55 min.  In seeing, evaluating and examining the patient. Reviewing medical records, labs, drawn plan of care. [facility],  Pager (please use [email or web address] to page/ text) Please use Epic Secure Chat for non-urgent communication (7AM-7PM)  If 7PM-7AM, please contact night-coverage [email or web address], 12/15/2023, 7:09 AM

## 2023-12-15 NOTE — Progress Notes (Signed)
 The beneficiary is confined to a single room and will need a bedside commode to safely return home at time of hospital discharge.

## 2023-12-15 NOTE — Progress Notes (Signed)
 Mobility Specialist Progress Note:    12/15/23 1325  Mobility  Activity Pivoted/transferred to/from [facility]  Level of Assistance Minimal assist, patient does 75% or more  Assistive Device None  Distance Ambulated (ft) 3 ft  Range of Motion/Exercises Active;All extremities  Activity Response Tolerated well  Mobility Referral Yes  Mobility visit 1 Mobility  Mobility Specialist Start Time (ACUTE ONLY) 1325  Mobility Specialist Stop Time (ACUTE ONLY) 1342  Mobility Specialist Time Calculation (min) (ACUTE ONLY) 17 min   Pt received in bed, requesting assistance to bathroom. Required MinA to stand and transfer with no AD. Tolerated well, c/o spasm like pain in genital area. Left on BSC, call bell in reach. NT notified, all needs met.   Alyah Boehning Mobility Specialist Please contact via Special Educational Needs Teacher or  Rehab office at [phone]

## 2023-12-15 NOTE — Inpatient Diabetes Management (Signed)
 Inpatient Diabetes Program Recommendations  AACE/ADA: New Consensus Statement on Inpatient Glycemic Control (2015)  Target Ranges:  Prepandial:   less than 140 mg/dL      Peak postprandial:   less than 180 mg/dL (1-2 hours)      Critically ill patients:  140 - 180 mg/dL   Lab Results  Component Value Date   GLUCAP 85 12/15/2023   HGBA1C 8.0 (H) 06/13/2021    Latest Reference Range & Units 12/14/23 21:53 12/14/23 22:32 12/15/23 05:39 12/15/23 07:13  Glucose-Capillary 70 - 99 mg/dL 894 (H) 87 55 (L) 85  (H): Data is abnormally high (L): Data is abnormally low  Diabetes history: DM2 Outpatient Diabetes medications:  Tresiba 48 units daily Jardiance 25 mg daily Metformin 500 mg bid Trulicity 3 mg weekly  Current orders for Inpatient glycemic control: Novolog 0-9 units tid, 0-5 units hs  Inpatient Diabetes Program Recommendations:   Noted patient had hypoglycemia without additional insulin administered to 44 per lab @ 0430 am Spoke with patient regarding CBGs lows @ home. Patient states she has some lows in 50's during the night but not during the day. Shared that she fell due to tripping and not a low blood sugar. Patient sees Dr. Nichole for endocrinology and agrees to read [facility] 3 CGM on appts and to notify office if glucose continues in 50's during the night.  Thank you, Graciella Arment E. Yzabella Crunk, RN, MSN, CNS, CDCES  Diabetes Coordinator Inpatient Glycemic Control Team Team Pager [phone] (8am-5pm) 12/15/2023 9:31 AM

## 2023-12-15 NOTE — TOC Initial Note (Signed)
 Transition of Care [facility]) - Initial/Assessment Note    Patient Details  Name: Shelley Jenkins MRN: [id number] Date of Birth: 1955/12/09  Transition of Care [facility]) CM/SW Contact:    Lucie Lunger, LCSWA Phone Number: 12/15/2023, 12:22 PM  Clinical Narrative:                 CSW updated that PT is recommending HH PT and DME for pt at D/C. CSW spoke with pt at bedside to review. Pt state she is agreeable to [facility] PT/OT being arranged and does not have an agency preferrence, [facility] accepted, agency info added to AVS. Pt states she needs a rolling walker and BSC, CSW requested that MD place DME orders. Once DME orders are in referral will be sent to Zach with [employer]. TOC to follow.   Expected Discharge Plan: Home w Home Health Services Barriers to Discharge: Continued Medical Work up   Patient Goals and CMS Choice Patient states their goals for this hospitalization and ongoing recovery are:: return home CMS Medicare.gov Compare Post Acute Care list provided to:: Patient Choice offered to / list presented to : Patient      Expected Discharge Plan and Services In-house Referral: Clinical Social Work Discharge Planning Services: CM Consult Post Acute Care Choice: Home Health, Durable Medical Equipment Living arrangements for the past 2 months: Single Family Home                 DME Arranged: Walker rolling DME Agency: [employer] Date DME Agency Contacted: 12/15/23   Representative spoke with at DME Agency: Darlyn HH Arranged: PT, OT HH Agency: [facility] Date [facility] Agency Contacted: 12/15/23   Representative spoke with at [facility] Agency: Darleene  Prior Living Arrangements/Services Living arrangements for the past 2 months: Single Family Home Lives with:: Spouse Patient language and need for interpreter reviewed:: Yes Do you feel safe going back to the place where you live?: Yes      Need for Family Participation in Patient Care: Yes (Comment) Care giver support system in place?: Yes  (comment)   Criminal Activity/Legal Involvement Pertinent to Current Situation/Hospitalization: No - Comment as needed  Activities of Daily Living   ADL Screening (condition at time of admission) Independently performs ADLs?: Yes (appropriate for developmental age) Is the patient deaf or have difficulty hearing?: No Does the patient have difficulty seeing, even when wearing glasses/contacts?: No Does the patient have difficulty concentrating, remembering, or making decisions?: No  Permission Sought/Granted                  Emotional Assessment Appearance:: Appears stated age Attitude/Demeanor/Rapport: Engaged Affect (typically observed): Accepting Orientation: : Oriented to Self, Oriented to Place, Oriented to  Time, Oriented to Situation Alcohol / Substance Use: Not Applicable Psych Involvement: No (comment)  Admission diagnosis:  Fall [W19.XXXA] Right hip pain [M25.551] Acute cystitis without hematuria [N30.00] Ambulatory dysfunction [R26.2] Hydronephrosis, unspecified hydronephrosis type [N13.30] Patient Active Problem List   Diagnosis Date Noted   Fall 12/14/2023   Constipation 11/11/2022   H/O adenomatous polyp of colon 11/11/2022   PCP:  Nichole Senior, MD Pharmacy:   [phone] [place], [place] - [address] AT [facility] [address] [place] [address] Phone: [phone] Fax: [phone]  [id number] [place], [place] - [address] AT [address] [place] [address] Phone: [phone] Fax: [phone]     Social Drivers of Health (SDOH) Social  History: SDOH Screenings   Tobacco Use: Low Risk  (12/14/2023)  Health Literacy: Low Risk (06/19/2020)   Received from [facility]   SDOH Interventions:     Readmission Risk Interventions     No data to display

## 2023-12-15 NOTE — Hospital Course (Addendum)
 Shelley Jenkins is a 69 y.o. female with medical history significant for hypertension, hyperlipidemia, type 2 diabetes, hypothyroidism, chronic anxiety/depression, history of bladder cancer, who presents to the ER after a mechanical fall at home.  She tripped over a step-stool that was left on the floor by family member.  No history of frequent falls.  Reported some increase in urinary frequency and foul-smelling urine.  No subjective fevers or chills.   ER:  right hip reveals no acute fracture or dislocation.  Moderate bilateral hip arthritic changes.  Abdomen and pelvis without contrast revealed moderate left hydronephroureter to the level of the urinary bladder.  No obstructing stone.  The possibility of a stricture or urothelial lesion at the left UVJ is not excluded.  Urology referral is advised.  Small nonobstructing bilateral renal calculi.  Mild fatty liver.  Small left adrenal adenoma.  Aortic atherosclerosis.   The patient received multiple doses of IV pain medication.  Unable to ambulate while in the ER.  EDP requested admission for pain control.  UA positive for pyuria.  Rocephin was initiated in the ER.

## 2023-12-16 DIAGNOSIS — N1330 Unspecified hydronephrosis: Secondary | ICD-10-CM | POA: Diagnosis not present

## 2023-12-16 DIAGNOSIS — W19XXXD Unspecified fall, subsequent encounter: Secondary | ICD-10-CM | POA: Diagnosis not present

## 2023-12-16 DIAGNOSIS — N3090 Cystitis, unspecified without hematuria: Secondary | ICD-10-CM | POA: Diagnosis not present

## 2023-12-16 DIAGNOSIS — M25552 Pain in left hip: Secondary | ICD-10-CM | POA: Diagnosis not present

## 2023-12-16 LAB — GLUCOSE, CAPILLARY: Glucose-Capillary: 150 mg/dL — ABNORMAL HIGH (ref 70–99)

## 2023-12-16 MED ORDER — OXYCODONE HCL 5 MG PO TABS: 5.0000 mg | ORAL_TABLET | ORAL | 0 refills | Status: AC | PRN

## 2023-12-16 MED ORDER — LIDOCAINE 5 % EX PTCH: 1.0000 | MEDICATED_PATCH | CUTANEOUS | 0 refills | Status: AC

## 2023-12-16 MED ORDER — IBUPROFEN 600 MG PO TABS: 600.0000 mg | ORAL_TABLET | Freq: Three times a day (TID) | ORAL | 0 refills | Status: AC

## 2023-12-16 MED ORDER — FLORANEX PO PACK: 1.0000 g | PACK | Freq: Three times a day (TID) | ORAL | 0 refills | Status: AC

## 2023-12-16 MED ORDER — ACETAMINOPHEN 500 MG PO TABS: 1000.0000 mg | ORAL_TABLET | Freq: Two times a day (BID) | ORAL | 0 refills | Status: AC

## 2023-12-16 MED ORDER — CIPROFLOXACIN HCL 500 MG PO TABS: 500.0000 mg | ORAL_TABLET | Freq: Two times a day (BID) | ORAL | 0 refills | Status: AC

## 2023-12-16 MED ORDER — TIZANIDINE HCL 4 MG PO TABS: 4.0000 mg | ORAL_TABLET | Freq: Three times a day (TID) | ORAL | 0 refills | Status: AC

## 2023-12-16 NOTE — TOC Transition Note (Signed)
 Transition of Care [facility]) - Discharge Note   Patient Details  Name: Shelley Jenkins MRN: [id number] Date of Birth: [birth year]  Transition of Care [facility]) CM/SW Contact:  Sharlyne Stabs, RN Phone Number: 12/16/2023, 10:20 AM   Clinical Narrative:   Patient discharging home with [facility] home health, orders placed, [facility] updated.    Final next level of care: Home w Home Health Services Barriers to Discharge: Barriers Resolved   Patient Goals and CMS Choice Patient states their goals for this hospitalization and ongoing recovery are:: return home CMS Medicare.gov Compare Post Acute Care list provided to:: Patient Choice offered to / list presented to : Patient     Discharge Placement    Patient and family notified of of transfer: 12/16/23  Discharge Plan and Services Additional resources added to the After Visit Summary for   In-house Referral: Clinical Social Work Discharge Planning Services: CM Consult Post Acute Care Choice: Home Health, Durable Medical Equipment          DME Arranged: Walker rolling DME Agency: [facility] Date DME Agency Contacted: 12/15/23   Representative spoke with at DME Agency: Darlyn HH Arranged: PT, OT HH Agency: [facility] Date [facility] Agency Contacted: 12/15/23   Representative spoke with at [facility] Agency: Darleene  Social Drivers of Health (SDOH) Interventions SDOH Screenings   Tobacco Use: Low Risk  (12/14/2023)  Health Literacy: Low Risk (06/19/2020)   Received from [facility]

## 2023-12-16 NOTE — Discharge Summary (Signed)
 Physician Discharge Summary   Patient: Shelley Jenkins MRN: [id number] DOB: 1955/06/28  Admit date:     12/14/2023  Discharge date: 12/16/23  Discharge Physician: Adriana DELENA Grams   PCP: Nichole Senior, MD   Recommendations at discharge:  Follow-up with PCP in 1 week Follow-up with a urologist-follow with the final urine culture, continue current antibiotics For precaution, continue PT OT   Discharge Diagnoses: Principal Problem:   Fall Active Problems:   Hydronephrosis   Hospital Course: [name] is a 68 y.o. female with medical history significant for hypertension, hyperlipidemia, type 2 diabetes, hypothyroidism, chronic anxiety/depression, history of bladder cancer, who presents to the ER after a mechanical fall at home.  She tripped over a step-stool that was left on the floor by family member.  No history of frequent falls.  Reported some increase in urinary frequency and foul-smelling urine.  No subjective fevers or chills.   ER:  right hip reveals no acute fracture or dislocation.  Moderate bilateral hip arthritic changes.  Abdomen and pelvis without contrast revealed moderate left hydronephroureter to the level of the urinary bladder.  No obstructing stone.  The possibility of a stricture or urothelial lesion at the left UVJ is not excluded.  Urology referral is advised.  Small nonobstructing bilateral renal calculi.  Mild fatty liver.  Small left adrenal adenoma.  Aortic atherosclerosis.   The patient received multiple doses of IV pain medication.  Unable to ambulate while in the ER.  EDP requested admission for pain control.  UA positive for pyuria.  Rocephin was initiated in the ER.  Mechanical fall, POA -Right hip pain -Remains significantly pain fall, optimizing analgesics, adding muscle relaxants, anti-inflammatories No fracture seen on x-ray and CT  PT OT evaluation-Home health PT OT arranged Fall precautions.   Presumed UTI, POA -CT scan revealing  revealed moderate  left hydronephroureter to the level of the urinary bladder.  No obstructing stone.  The possibility of a stricture or urothelial lesion at the left UVJ is not excluded.  - Consulted urology Dr. Sherrilee for further evaluation recommendations - Urine cultures no growth to date -IV antibiotic Rocephin>>> switch to p.o. ciprofloxacin   Continue discussion with the patient and Dr. Sherrilee patient is to follow-up with her primary urologist Dr. Carolee regarding possible left hydronephroureter   Note: Per patient has a history of bladder cancer, status posttreatment approximately 2 years ago follow-up as needed with urologist   Chronic anxiety/depression Resume home regimen.   Hyperlipidemia -continue to hold statins for now   Type 2 diabetes with hyperlipidemia Last hemoglobin A1c 8.0 on 06/13/2021. Resume home regimen, diabetic diet   Hypothyroidism- Resume home levothyroxine.   Generalized weakness PT OT, fall precautions         Pain control - [place]  Controlled Substance Reporting System database was reviewed. and patient was instructed, not to drive, operate heavy machinery, perform activities at heights, swimming or participation in water activities or provide baby-sitting services while on Pain, Sleep and Anxiety Medications; until their outpatient Physician has advised to do so again. Also recommended to not to take more than prescribed Pain, Sleep and Anxiety Medications.  Consultants: Urologist Dr. Sherrilee PT/OT/TOC Procedures performed: None Disposition: Home with home health Diet recommendation:  Discharge Diet Orders (From admission, onward)     Start     Ordered   12/16/23 0000  Diet - low sodium heart healthy        12/16/23 1011  Cardiac and Carb modified diet DISCHARGE MEDICATION: Allergies as of 12/16/2023       Reactions   Other Other (See Comments)   An antibiotic used for UTI, unknown name Hallucinations visual, nauseous, weight loss, no  appetite   Penicillins Hives, Rash        Medication List     PAUSE taking these medications    atorvastatin 20 MG tablet Wait to take this until: December 21, 2023 Commonly known as: LIPITOR Take 20 mg by mouth daily.   metFORMIN 500 MG tablet Wait to take this until: December 21, 2023 Commonly known as: GLUCOPHAGE Take 500 mg by mouth 2 (two) times daily with a meal.       STOP taking these medications    Cephalexin 250 MG tablet   fluconazole 150 MG tablet Commonly known as: DIFLUCAN   losartan 100 MG tablet Commonly known as: COZAAR   venlafaxine XR 150 MG 24 hr capsule Commonly known as: EFFEXOR-XR       TAKE these medications    acetaminophen  500 MG tablet Commonly known as: TYLENOL  Take 2 tablets (1,000 mg total) by mouth every 12 (twelve) hours for 10 days.   ciprofloxacin  500 MG tablet Commonly known as: Cipro  Take 1 tablet (500 mg total) by mouth 2 (two) times daily for 5 days.   escitalopram 10 MG tablet Commonly known as: LEXAPRO Take 10 mg by mouth daily.   ibuprofen 600 MG tablet Commonly known as: ADVIL Take 1 tablet (600 mg total) by mouth 3 (three) times daily for 3 days.   Jardiance 25 MG Tabs tablet Generic drug: empagliflozin Take 25 mg by mouth daily.   lactobacillus Pack Take 1 packet (1 g total) by mouth 3 (three) times daily with meals.   levothyroxine 50 MCG tablet Commonly known as: SYNTHROID Take 50 mcg by mouth daily before breakfast.   lidocaine  5 % Commonly known as: LIDODERM  Place 1 patch onto the skin daily. Remove & Discard patch within 12 hours or as directed by MD   LORazepam 0.5 MG tablet Commonly known as: ATIVAN Take 1 mg by mouth at bedtime.   lubiprostone  24 MCG capsule Commonly known as: AMITIZA  TAKE 1 CAPSULE BY MOUTH 2 TIMES DAILY WITH A MEAL   Melatonin 10 MG Tabs Take 10 mg by mouth at bedtime.   oxyCODONE  5 MG immediate release tablet Commonly known as: Oxy IR/ROXICODONE  Take 1 tablet  (5 mg total) by mouth every 4 (four) hours as needed for up to 3 days for moderate pain (pain score 4-6) or breakthrough pain.   tiZANidine 4 MG tablet Commonly known as: ZANAFLEX Take 1 tablet (4 mg total) by mouth 3 (three) times daily for 10 days.   Tresiba FlexTouch 100 UNIT/ML FlexTouch Pen Generic drug: insulin degludec Inject 48 Units into the skin daily.   Trulicity 3 MG/0.5ML Soaj Generic drug: Dulaglutide Inject 3 mg into the skin every Sunday.               Durable Medical Equipment  (From admission, onward)           Start     Ordered   12/15/23 1234  For home use only DME Walker rolling  Once       Question Answer Comment  Walker: With 5 Inch Wheels   Patient needs a walker to treat with the following condition Hip pain      11 /12/25 1233   12/15/23 1233  For home use only DME  Bedside commode  Once       Question:  Patient needs a bedside commode to treat with the following condition  Answer:  Weakness   12/15/23 1233            Contact information for after-discharge care     Home Medical Care     [facility] .   Service: Home Health Services Contact information: [address] [place]  [phone]                    Discharge Exam: Fredricka Weights   12/14/23 1348 12/14/23 2202  Weight: 59 kg 59.5 kg        General:  AAO x 3,  cooperative, no distress;   HEENT:  Normocephalic, PERRL, otherwise with in Normal limits   Neuro:  CNII-XII intact. , normal motor and sensation, reflexes intact   Lungs:   Clear to auscultation BL, Respirations unlabored,  No wheezes / crackles  Cardio:    S1/S2, RRR, No murmure, No Rubs or Gallops   Abdomen:  Soft, non-tender, bowel sounds active all four quadrants, no guarding or peritoneal signs.  Muscular  skeletal:  Right hip tenderness, therefore limited range of motion Limited exam -global generalized weaknesses - in bed, able  to move all 4 extremities,   2+ pulses,  symmetric, No pitting edema  Skin:  Dry, warm to touch, negative for any Rashes,  Wounds: Please see nursing documentation          Condition at discharge: good  The results of significant diagnostics from this hospitalization (including imaging, microbiology, ancillary and laboratory) are listed below for reference.   Imaging Studies: CT HEMATURIA WORKUP Result Date: 12/15/2023 EXAM: CT UROGRAM 12/15/2023 02:31:00 PM TECHNIQUE: CT of the abdomen and pelvis was performed without and with the administration of 125 mL iohexol  (OMNIPAQUE ) 300 MG/ML solution as per CT urogram protocol. Multiplanar reformatted images as well as MIP urogram images are provided for review. Automated exposure control, iterative reconstruction, and/or weight based adjustment of the mA/kV was utilized to reduce the radiation dose to as low as reasonably achievable. COMPARISON: None available. CLINICAL HISTORY: 287908 Hydronephrosis, left [id number] Hydronephrosis, left FINDINGS: LOWER CHEST: No acute abnormality. LIVER: The liver is unremarkable. GALLBLADDER AND BILE DUCTS: Gallbladder is unremarkable. No biliary ductal dilatation. SPLEEN: No acute abnormality. PANCREAS: No acute abnormality. ADRENAL GLANDS: No acute abnormality. KIDNEYS, URETERS AND BLADDER: Left hydroureter hydronephrosis. Hydroureter is prominent with the left ureter measuring 17 mm in diameter. The hydroureter extends to the left vesicoureteral junction. There is uniform enhancement of the urothelium on the left. Delayed imaging demonstrates pooling of contrast in the dilated left renal pelvis and calyces. Minimal contrast within the left ureter itself. No enhancing lesion identified within the distal left ureter at the vesicoureteral junction. Obstruction appears chronic but no etiology identified. No stones in the kidneys or ureters. No perinephric or periureteral stranding. Urinary bladder is unremarkable. GI AND  BOWEL: Stomach demonstrates no acute abnormality. There is no bowel obstruction. PERITONEUM AND RETROPERITONEUM: No ascites. No free air. VASCULATURE: Aorta is normal in caliber. LYMPH NODES: No lymphadenopathy. REPRODUCTIVE ORGANS: No acute abnormality. BONES AND SOFT TISSUES: No acute osseous abnormality. No focal soft tissue abnormality. IMPRESSION: 1. Left hydroureter and hydronephrosis, likely chronic, with no identified etiology. 2. Obstruction distal at the left vesicoureteral junction. 3. No enhancing lesion identified within the distal left ureter at the vesicoureteral junction. Electronically signed by: Norleen  Edmunds MD 12/15/2023 03:12 PM EST RP Workstation: [id number]   CT ABDOMEN PELVIS WO CONTRAST Result Date: 12/14/2023 CLINICAL DATA:  Dilated left ureter seen on pelvic CT. EXAM: CT ABDOMEN AND PELVIS WITHOUT CONTRAST TECHNIQUE: Multidetector CT imaging of the abdomen and pelvis was performed following the standard protocol without IV contrast. RADIATION DOSE REDUCTION: This exam was performed according to the departmental dose-optimization program which includes automated exposure control, adjustment of the mA and/or kV according to patient size and/or use of iterative reconstruction technique. COMPARISON:  CT abdomen pelvis dated 05/06/2021. FINDINGS: Evaluation of this exam is limited in the absence of intravenous contrast. Lower chest: No acute abnormality. No intra-abdominal free air or free fluid. Hepatobiliary: Mild fatty liver. No biliary dilatation. Cholecystectomy. Pancreas: Unremarkable. No pancreatic ductal dilatation or surrounding inflammatory changes. Spleen: Normal in size without focal abnormality. Adrenals/Urinary Tract: The right adrenal glands unremarkable. There is a 1 cm left adrenal adenoma. There is a 3 mm nonobstructing left renal interpolar calculus. A punctate nonobstructing stone noted in the interpolar right kidney. No obstructing stone. There is moderate left  hydronephrosis and hydroureter to the level of the urinary bladder. The possibility of a stricture or urothelial lesion at the left UVJ is not excluded. Urology referral is advised. There is no hydronephrosis on the right. The right ureter and urinary bladder appear unremarkable. Stomach/Bowel: There is moderate stool throughout the colon. There is no bowel obstruction. Appendectomy. Vascular/Lymphatic: Mild atherosclerotic calcification of the abdominal aorta. The IVC is unremarkable. No portal venous gas. There is no adenopathy. Reproductive: Hysterectomy.  No suspicious adnexal masses. Other: None Musculoskeletal: Osteopenia.  No acute osseous pathology. IMPRESSION: 1. Moderate left hydronephroureter to the level of the urinary bladder. No obstructing stone. The possibility of a stricture or urothelial lesion at the left UVJ is not excluded. Urology referral is advised. 2. Small nonobstructing bilateral renal calculi. 3. Mild fatty liver. 4. Small left adrenal adenoma. 5.  Aortic Atherosclerosis (ICD10-I70.0). Electronically Signed   By: Vanetta Chou M.D.   On: 12/14/2023 19:10   CT PELVIS WO CONTRAST Result Date: 12/14/2023 CLINICAL DATA:  Fall today with right hip pain. Suspect hip fracture. EXAM: CT PELVIS WITHOUT CONTRAST TECHNIQUE: Multidetector CT imaging of the pelvis was performed following the standard protocol without intravenous contrast. RADIATION DOSE REDUCTION: This exam was performed according to the departmental dose-optimization program which includes automated exposure control, adjustment of the mA and/or kV according to patient size and/or use of iterative reconstruction technique. COMPARISON:  Plain films earlier today.  CT 05/06/2021 FINDINGS: Urinary Tract: Moderate dilatation of the visualized left ureter measuring to 2.1 cm in diameter. This is new compared to the prior CT from 2023. Right ureter and bladder are normal. Bowel:  Normal. Vascular/Lymphatic: Visualized vascular  structures are unremarkable. No evidence of adenopathy. Reproductive:  Prior sternotomy.  Adnexal regions are unremarkable. Other:  No free pelvic fluid. Musculoskeletal: No evidence of acute fracture or dislocation involving the right hip. Left hip and remaining pelvic bones demonstrate no evidence of acute fracture. Bone island over the right iliac bone. Mild degenerative change of the spine. Subtle grade 1 anterolisthesis of L4 on L5 unchanged. IMPRESSION: 1. No acute fracture or dislocation involving the right hip. 2. Moderate dilatation of the visualized left ureter measuring to 2.1 cm in diameter. This is new compared to the prior CT from 2023. Recommend clinical correlation and consider further evaluation with CT abdomen/pelvis without contrast. Electronically Signed   By: Toribio Jearld HERO.D.  On: 12/14/2023 16:00   DG Hip Unilat W or Wo Pelvis 2-3 Views Right Result Date: 12/14/2023 CLINICAL DATA:  Fall and right hip pain. EXAM: DG HIP (WITH OR WITHOUT PELVIS) 2-3V RIGHT COMPARISON:  None Available. FINDINGS: There is no acute fracture or dislocation. The bones are osteopenic. Moderate bilateral hip arthritic changes. The soft tissues are unremarkable. Sclerotic focus in the right iliac bone present on the CT of 05/06/2021. IMPRESSION: 1. No acute fracture or dislocation. 2. Moderate bilateral hip arthritic changes. Electronically Signed   By: Vanetta Chou M.D.   On: 12/14/2023 14:27    Microbiology: Results for orders placed or performed during the hospital encounter of 12/14/23  Urine Culture     Status: Abnormal (Preliminary result)   Collection Time: 12/14/23  7:34 PM   Specimen: Urine, Clean Catch  Result Value Ref Range Status   Specimen Description   Final    URINE, CLEAN CATCH Performed at [facility], [address] [place], [place] [address]    Special Requests   Final    NONE Performed at [facility], [address] [place], [place] [address]    Culture (A)  Final     >=100,000 COLONIES/mL GRAM NEGATIVE RODS SUSCEPTIBILITIES TO FOLLOW Performed at [facility], [address] [place], [place] [address]    Report Status PENDING  Incomplete    Labs: CBC: Recent Labs  Lab 12/14/23 1435 12/15/23 0430  WBC 11.9* 8.8  NEUTROABS 9.1*  --   HGB 14.5 13.7  HCT 43.9 42.4  MCV 95.9 97.0  PLT 269 231   Basic Metabolic Panel: Recent Labs  Lab 12/14/23 1435 12/15/23 0430  NA 143 143  K 4.3 3.3*  CL 101 105  CO2 25 25  GLUCOSE 131* 44*  BUN 15 14  CREATININE 0.81 0.63  CALCIUM 10.5* 9.4  MG  --  2.4  PHOS  --  3.4   Liver Function Tests: Recent Labs  Lab 12/14/23 1435  AST 21  ALT 22  ALKPHOS 103  BILITOT 0.6  PROT 8.0  ALBUMIN 4.2   CBG: Recent Labs  Lab 12/15/23 0713 12/15/23 1144 12/15/23 1619 12/15/23 2037 12/16/23 0739  GLUCAP 85 130* 254* 165* 150*    Discharge time spent: greater than 45 minutes.  Signed: Adriana DELENA Grams, MD [facility] 12/16/2023

## 2023-12-16 NOTE — Consult Note (Signed)
 Urology Consult  Referring physician: Dr. Twana Reason for referral: left hydronephrosis  Chief Complaint: left hydronephrosis  History of Present Illness: Shelley Jenkins is a 68yo with a history of bladder cancer admitted after a fall and for a UTI. She underwent CT on admission and was found to have new moderate left hydronephrosis to the UVJ. She denies any flank pain. She denies any history of nephrolithiasis. Creatinine is normal.  She underwent bladder tumor resection 5/15/202 and 09/28/2022 and had a left retrograde pyelogram at that time which showed mild left renal pelvic fullness and eflux of contrast and ureteral catheter removal consistent with reflux due to prior left ureteral orifice resection. She denies any significant LUTS. NO other complaints today. 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 with no ureteral mass  Past Medical History:  Diagnosis Date   Allergy    seasonal allergies   Anxiety    on meds   Arthritis    Blood transfusion without reported diagnosis    hysterectomy   Cancer (HCC)    bladder   Cataract    bilateral sx   Depression    on meds   Diabetes mellitus without complication (HCC)    on meds   Difficult airway for intubation, initial encounter 06/16/2021   DLx4 by student, CRNA, & MDA, then glidescope   GERD (gastroesophageal reflux disease)    Hypercholesteremia    on meds   Hypertension    on meds   Hypothyroidism    on meds   PONV (postoperative nausea and vomiting)    Past Surgical History:  Procedure Laterality Date   ABDOMINAL HYSTERECTOMY     APPENDECTOMY     yrs ago done with ovary removal   BIOPSY  05/05/2022   Procedure: BIOPSY;  Surgeon: Cindie Carlin POUR, DO;  [facility]: [facility];  Service: Endoscopy;;   BREAST SURGERY Right    bening lump removed   CATARACT EXTRACTION W/PHACO Left 11/28/2012   Procedure: CATARACT EXTRACTION PHACO AND INTRAOCULAR LENS PLACEMENT (IOC);  Surgeon: Cherene Mania,  MD;  Location: [facility];  Service: Ophthalmology;  Laterality: Left;  CDE:  11.60   CATARACT EXTRACTION W/PHACO Right 12/08/2012   Procedure: CATARACT EXTRACTION PHACO AND INTRAOCULAR LENS PLACEMENT (IOC);  Surgeon: Cherene Mania, MD;  Location: [facility];  Service: Ophthalmology;  Laterality: Right;  CDE:17.33   CHOLECYSTECTOMY     COLONOSCOPY  2016   DB-moviprep (good)-TA x 1   COLONOSCOPY N/A 09/14/2023   Procedure: COLONOSCOPY;  Surgeon: Cindie Carlin POUR, DO;  Location: [facility];  Service: Endoscopy;  Laterality: N/A;  9:45 am, asa 2, pt knows to arrive at 7:15   COLONOSCOPY WITH PROPOFOL  N/A 05/05/2022   Procedure: COLONOSCOPY WITH PROPOFOL ;  Surgeon: Cindie Carlin POUR, DO;  Location: [facility];  Service: Endoscopy;  Laterality: N/A;  11:00am, asa 2, pt knows to arrive at 6:30   CYSTOSCOPY W/ RETROGRADES Bilateral 09/28/2022   Procedure: CYSTOSCOPY WITH BILATERAL RETROGRADE PYELOGRAM;  Surgeon: Carolee Sherwood JONETTA DOUGLAS, MD;  Location: [facility];  Service: Urology;  Laterality: Bilateral;   CYSTOSCOPY W/ URETERAL STENT PLACEMENT Left 06/16/2021   Procedure: CYSTOSCOPY WITH RETROGRADE PYELOGRAM/URETERAL STENT PLACEMENT;  Surgeon: Carolee Sherwood JONETTA DOUGLAS, MD;  Location: [facility];  Service: Urology;  Laterality: Left;   POLYPECTOMY  2016   1 TA   POLYPECTOMY  05/05/2022   Procedure: POLYPECTOMY;  Surgeon: Cindie Carlin POUR, DO;  [facility]: [facility];  Service: Endoscopy;;  TRANSURETHRAL RESECTION OF BLADDER TUMOR WITH MITOMYCIN -C N/A 06/16/2021   Procedure: TRANSURETHRAL RESECTION OF BLADDER TUMOR;  Surgeon: Carolee Sherwood JONETTA DOUGLAS, MD;  Location: [facility];  Service: Urology;  Laterality: N/A;  1 HR FOR CASE    Medications: I have reviewed the patient's current medications. Allergies:  Allergies  Allergen Reactions   Other Other (See Comments)    An antibiotic used for UTI, unknown name Hallucinations visual, nauseous, weight loss, no appetite   Penicillins Hives and Rash    Family  History  Problem Relation Age of Onset   Stomach cancer Maternal Grandmother        ?stomach CA-unknown type   Colon cancer Neg Hx    Colon polyps Neg Hx    Esophageal cancer Neg Hx    Rectal cancer Neg Hx    Social History:  reports that she has never smoked. She has never been exposed to tobacco smoke. She has never used smokeless tobacco. She reports that she does not drink alcohol and does not use drugs.  Review of Systems  Musculoskeletal:  Positive for arthralgias.  All other systems reviewed and are negative.   Physical Exam:  Vital signs in last 24 hours: Temp:  [97.6 F (36.4 C)-98.1 F (36.7 C)] 97.6 F (36.4 C) (11/13 0537) Pulse Rate:  [79-108] 79 (11/13 0537) Resp:  [18-20] 18 (11/13 0537) BP: (106-142)/(62-79) 106/67 (11/13 0537) SpO2:  [92 %-97 %] 97 % (11/13 0537) Physical Exam Vitals and nursing note reviewed.  Constitutional:      Appearance: Normal appearance.  HENT:     Head: Normocephalic and atraumatic.     Mouth/Throat:     Mouth: Mucous membranes are moist.  Eyes:     Extraocular Movements: Extraocular movements intact.     Pupils: Pupils are equal, round, and reactive to light.  Cardiovascular:     Rate and Rhythm: Normal rate and regular rhythm.  Pulmonary:     Effort: Pulmonary effort is normal. No respiratory distress.  Abdominal:     General: Abdomen is flat. There is no distension.  Musculoskeletal:        General: No swelling. Normal range of motion.     Cervical back: Normal range of motion and neck supple.  Skin:    General: Skin is warm and dry.  Neurological:     General: No focal deficit present.     Mental Status: She is alert and oriented to person, place, and time.  Psychiatric:        Mood and Affect: Mood normal.        Behavior: Behavior normal.        Thought Content: Thought content normal.        Judgment: Judgment normal.     Laboratory Data:  Results for orders placed or performed during the hospital encounter  of 12/14/23 (from the past 72 hours)  Comprehensive metabolic panel     Status: Abnormal   Collection Time: 12/14/23  2:35 PM  Result Value Ref Range   Sodium 143 135 - 145 mmol/L   Potassium 4.3 3.5 - 5.1 mmol/L   Chloride 101 98 - 111 mmol/L   CO2 25 22 - 32 mmol/L   Glucose, Bld 131 (H) 70 - 99 mg/dL    Comment: Glucose reference range applies only to samples taken after fasting for at least 8 hours.   BUN 15 8 - 23 mg/dL   Creatinine, Ser 9.18 0.44 - 1.00 mg/dL   Calcium 89.4 (  H) 8.9 - 10.3 mg/dL   Total Protein 8.0 6.5 - 8.1 g/dL   Albumin 4.2 3.5 - 5.0 g/dL   AST 21 15 - 41 U/L   ALT 22 0 - 44 U/L   Alkaline Phosphatase 103 38 - 126 U/L   Total Bilirubin 0.6 0.0 - 1.2 mg/dL   GFR, Estimated >39 >39 mL/min    Comment: (NOTE) Calculated using the CKD-EPI Creatinine Equation (2021)    Anion gap 17 (H) 5 - 15    Comment: Performed at [facility], [address], [place], [place] [address]  CBC with Differential     Status: Abnormal   Collection Time: 12/14/23  2:35 PM  Result Value Ref Range   WBC 11.9 (H) 4.0 - 10.5 K/uL   RBC 4.58 3.87 - 5.11 MIL/uL   Hemoglobin 14.5 12.0 - 15.0 g/dL   HCT 56.0 63.9 - 53.9 %   MCV 95.9 80.0 - 100.0 fL   MCH 31.7 26.0 - 34.0 pg   MCHC 33.0 30.0 - 36.0 g/dL   RDW 85.8 88.4 - 84.4 %   Platelets 269 150 - 400 K/uL   nRBC 0.0 0.0 - 0.2 %   Neutrophils Relative % 76 %   Neutro Abs 9.1 (H) 1.7 - 7.7 K/uL   Lymphocytes Relative 17 %   Lymphs Abs 2.0 0.7 - 4.0 K/uL   Monocytes Relative 6 %   Monocytes Absolute 0.7 0.1 - 1.0 K/uL   Eosinophils Relative 0 %   Eosinophils Absolute 0.0 0.0 - 0.5 K/uL   Basophils Relative 0 %   Basophils Absolute 0.0 0.0 - 0.1 K/uL   Immature Granulocytes 1 %   Abs Immature Granulocytes 0.06 0.00 - 0.07 K/uL    Comment: Performed at [facility], [address] [place], [place] [address]  Hemoglobin A1c     Status: Abnormal   Collection Time: 12/14/23  2:35 PM  Result Value Ref Range   Hgb A1c MFr Bld 6.5  (H) 4.8 - 5.6 %    Comment: (NOTE) Diagnosis of Diabetes The following HbA1c ranges recommended by the American Diabetes Association (ADA) may be used as an aid in the diagnosis of diabetes mellitus.  Hemoglobin             Suggested A1C NGSP%              Diagnosis  <5.7                   Non Diabetic  5.7-6.4                Pre-Diabetic  >6.4                   Diabetic  <7.0                   Glycemic control for                       adults with diabetes.     Mean Plasma Glucose 139.85 mg/dL    Comment: Performed at [facility], [address], [place], [place] [address]  Urinalysis, Routine w reflex microscopic -Urine, Clean Catch     Status: Abnormal   Collection Time: 12/14/23  7:34 PM  Result Value Ref Range   Color, Urine YELLOW YELLOW   APPearance HAZY (A) CLEAR   Specific Gravity, Urine 1.022 1.005 - 1.030   pH 5.0 5.0 - 8.0  Glucose, UA >=500 (A) NEGATIVE mg/dL   Hgb urine dipstick NEGATIVE NEGATIVE   Bilirubin Urine NEGATIVE NEGATIVE   Ketones, ur NEGATIVE NEGATIVE mg/dL   Protein, ur NEGATIVE NEGATIVE mg/dL   Nitrite POSITIVE (A) NEGATIVE   Leukocytes,Ua SMALL (A) NEGATIVE   RBC / HPF 0-5 0 - 5 RBC/hpf   WBC, UA >50 0 - 5 WBC/hpf   Bacteria, UA MANY (A) NONE SEEN   Squamous Epithelial / HPF 6-10 0 - 5 /HPF   Non Squamous Epithelial 0-5 (A) NONE SEEN    Comment: Performed at [facility], [address] [place], [place] [address]  CBG monitoring, ED     Status: Abnormal   Collection Time: 12/14/23  9:53 PM  Result Value Ref Range   Glucose-Capillary 105 (H) 70 - 99 mg/dL    Comment: Glucose reference range applies only to samples taken after fasting for at least 8 hours.  Glucose, capillary     Status: None   Collection Time: 12/14/23 10:32 PM  Result Value Ref Range   Glucose-Capillary 87 70 - 99 mg/dL    Comment: Glucose reference range applies only to samples taken after fasting for at least 8 hours.   Comment 1 Notify RN    Comment 2  Document in Chart   CBC     Status: None   Collection Time: 12/15/23  4:30 AM  Result Value Ref Range   WBC 8.8 4.0 - 10.5 K/uL   RBC 4.37 3.87 - 5.11 MIL/uL   Hemoglobin 13.7 12.0 - 15.0 g/dL   HCT 57.5 63.9 - 53.9 %   MCV 97.0 80.0 - 100.0 fL   MCH 31.4 26.0 - 34.0 pg   MCHC 32.3 30.0 - 36.0 g/dL   RDW 86.1 88.4 - 84.4 %   Platelets 231 150 - 400 K/uL   nRBC 0.0 0.0 - 0.2 %    Comment: Performed at [facility], [address] [place], [place] [address]  Basic metabolic panel     Status: Abnormal   Collection Time: 12/15/23  4:30 AM  Result Value Ref Range   Sodium 143 135 - 145 mmol/L   Potassium 3.3 (L) 3.5 - 5.1 mmol/L    Comment: Delta check noted    Chloride 105 98 - 111 mmol/L   CO2 25 22 - 32 mmol/L   Glucose, Bld 44 (LL) 70 - 99 mg/dL    Comment: Critical Value, Read Back and verified with L. Baker [phone] [id number], Viray,J Glucose reference range applies only to samples taken after fasting for at least 8 hours.    BUN 14 8 - 23 mg/dL   Creatinine, Ser 9.36 0.44 - 1.00 mg/dL   Calcium 9.4 8.9 - 89.6 mg/dL   GFR, Estimated >39 >39 mL/min    Comment: (NOTE) Calculated using the CKD-EPI Creatinine Equation (2021)    Anion gap 13 5 - 15    Comment: Performed at [facility], [address] [place], [place] [address]  Magnesium     Status: None   Collection Time: 12/15/23  4:30 AM  Result Value Ref Range   Magnesium 2.4 1.7 - 2.4 mg/dL    Comment: Performed at [facility], [address], [place], [place] [address]  Phosphorus     Status: None   Collection Time: 12/15/23  4:30 AM  Result Value Ref Range   Phosphorus 3.4 2.5 - 4.6 mg/dL    Comment: Performed at [facility], [address] [place], [place] [address]  HIV Antibody (routine testing w rflx)     Status: None   Collection Time: 12/15/23  4:30 AM  Result Value Ref Range   HIV Screen 4th Generation wRfx Non Reactive Non Reactive    Comment: Performed at [facility], [address] [place],  [place] [address]  CK total and CKMB (cardiac)not at [facility]     Status: Abnormal   Collection Time: 12/15/23  5:00 AM  Result Value Ref Range   Total CK 28 (L) 38 - 234 U/L   CK, MB 2.0 0.5 - 5.0 ng/mL    Comment: Performed at [facility], [address], [place], [place] [address]  Glucose, capillary     Status: Abnormal   Collection Time: 12/15/23  5:39 AM  Result Value Ref Range   Glucose-Capillary 55 (L) 70 - 99 mg/dL    Comment: Glucose reference range applies only to samples taken after fasting for at least 8 hours.   Comment 1 Notify RN    Comment 2 Document in Chart   Glucose, capillary     Status: None   Collection Time: 12/15/23  7:13 AM  Result Value Ref Range   Glucose-Capillary 85 70 - 99 mg/dL    Comment: Glucose reference range applies only to samples taken after fasting for at least 8 hours.  Glucose, capillary     Status: Abnormal   Collection Time: 12/15/23 11:44 AM  Result Value Ref Range   Glucose-Capillary 130 (H) 70 - 99 mg/dL    Comment: Glucose reference range applies only to samples taken after fasting for at least 8 hours.  Glucose, capillary     Status: Abnormal   Collection Time: 12/15/23  4:19 PM  Result Value Ref Range   Glucose-Capillary 254 (H) 70 - 99 mg/dL    Comment: Glucose reference range applies only to samples taken after fasting for at least 8 hours.   Comment 1 Notify RN    Comment 2 Document in Chart   Glucose, capillary     Status: Abnormal   Collection Time: 12/15/23  8:37 PM  Result Value Ref Range   Glucose-Capillary 165 (H) 70 - 99 mg/dL    Comment: Glucose reference range applies only to samples taken after fasting for at least 8 hours.   Comment 1 Notify RN    Comment 2 Document in Chart    No results found for this or any previous visit (from the past 240 hours). Creatinine: Recent Labs    12/14/23 1435 12/15/23 0430  CREATININE 0.81 0.63   Baseline Creatinine: 0.7  Impression/Assessment:  68yo with left  hydronephrosis  Plan:  We discussed the natural history and the various causes of hydronephrosis. Given her history of prior left ureteral orifice resection her hydronephrosis is likely related to reflux with a full bladder. We also discussed the possibility of a distal ureteral stricture and the workup including lasix renogram and diagnostic ureteroscopy. Since she is asymptomatic currently she can followup with her Urologist, Dr. Carolee, at [facility] for her hydronephrosis workup.   Shelley Jenkins 12/16/2023, 7:29 AM

## 2023-12-17 LAB — URINE CULTURE: Culture: >=100000 — AB

## 2024-01-05 ENCOUNTER — Encounter: Payer: Self-pay | Admitting: Internal Medicine
